# Patient Record
Sex: Male | Born: 1945 | Race: White | Hispanic: No
Health system: Southern US, Community
[De-identification: ages and names within clinical notes are randomized; demographics above are authoritative.]

## PROBLEM LIST (undated history)

## (undated) DIAGNOSIS — F419 Anxiety disorder, unspecified: Secondary | ICD-10-CM

## (undated) DIAGNOSIS — R42 Dizziness and giddiness: Secondary | ICD-10-CM

## (undated) DIAGNOSIS — K625 Hemorrhage of anus and rectum: Secondary | ICD-10-CM

## (undated) DIAGNOSIS — I10 Essential (primary) hypertension: Secondary | ICD-10-CM

## (undated) DIAGNOSIS — E785 Hyperlipidemia, unspecified: Secondary | ICD-10-CM

## (undated) DIAGNOSIS — C20 Malignant neoplasm of rectum: Secondary | ICD-10-CM

## (undated) DIAGNOSIS — I2510 Atherosclerotic heart disease of native coronary artery without angina pectoris: Secondary | ICD-10-CM

## (undated) DIAGNOSIS — C439 Malignant melanoma of skin, unspecified: Secondary | ICD-10-CM

## (undated) DIAGNOSIS — F32A Depression, unspecified: Secondary | ICD-10-CM

## (undated) DIAGNOSIS — D128 Benign neoplasm of rectum: Secondary | ICD-10-CM

## (undated) DIAGNOSIS — M1990 Unspecified osteoarthritis, unspecified site: Secondary | ICD-10-CM

## (undated) DIAGNOSIS — I208 Other forms of angina pectoris: Secondary | ICD-10-CM

## (undated) DIAGNOSIS — I2089 Other forms of angina pectoris: Secondary | ICD-10-CM

## (undated) DIAGNOSIS — I219 Acute myocardial infarction, unspecified: Secondary | ICD-10-CM

## (undated) DIAGNOSIS — F329 Major depressive disorder, single episode, unspecified: Secondary | ICD-10-CM

## (undated) HISTORY — DX: Benign neoplasm of rectum: D12.8

## (undated) HISTORY — DX: Hyperlipidemia, unspecified: E78.5

## (undated) HISTORY — DX: Acute myocardial infarction, unspecified: I21.9

## (undated) HISTORY — PX: CORONARY ANGIOPLASTY WITH STENT PLACEMENT: SHX49

## (undated) HISTORY — DX: Other forms of angina pectoris: I20.8

## (undated) HISTORY — PX: FINGER SURGERY: SHX640

## (undated) HISTORY — PX: TONSILLECTOMY: SUR1361

## (undated) HISTORY — DX: Depression, unspecified: F32.A

## (undated) HISTORY — DX: Dizziness and giddiness: R42

## (undated) HISTORY — DX: Major depressive disorder, single episode, unspecified: F32.9

## (undated) HISTORY — DX: Atherosclerotic heart disease of native coronary artery without angina pectoris: I25.10

## (undated) HISTORY — DX: Other forms of angina pectoris: I20.89

## (undated) HISTORY — DX: Hemorrhage of anus and rectum: K62.5

## (undated) HISTORY — DX: Essential (primary) hypertension: I10

## (undated) HISTORY — DX: Malignant melanoma of skin, unspecified: C43.9

---

## 2002-01-03 DIAGNOSIS — C439 Malignant melanoma of skin, unspecified: Secondary | ICD-10-CM

## 2002-01-03 HISTORY — DX: Malignant melanoma of skin, unspecified: C43.9

## 2005-10-03 ENCOUNTER — Inpatient Hospital Stay (HOSPITAL_COMMUNITY): Admission: AD | Admit: 2005-10-03 | Discharge: 2005-10-04 | Payer: Self-pay | Admitting: Internal Medicine

## 2005-10-03 ENCOUNTER — Ambulatory Visit: Payer: Self-pay | Admitting: Critical Care Medicine

## 2005-10-04 IMAGING — US US CHEST/MEDIASTINUM
1 series · 6 of 6 positions shown · non-contrast
Comparison: none

CLINICAL DATA: Effusion. 
 CHEST ULTRASOUND:

[Series 1: unknown · 0.33mm/px · 6 of 6 slices shown]
[im 1/6]
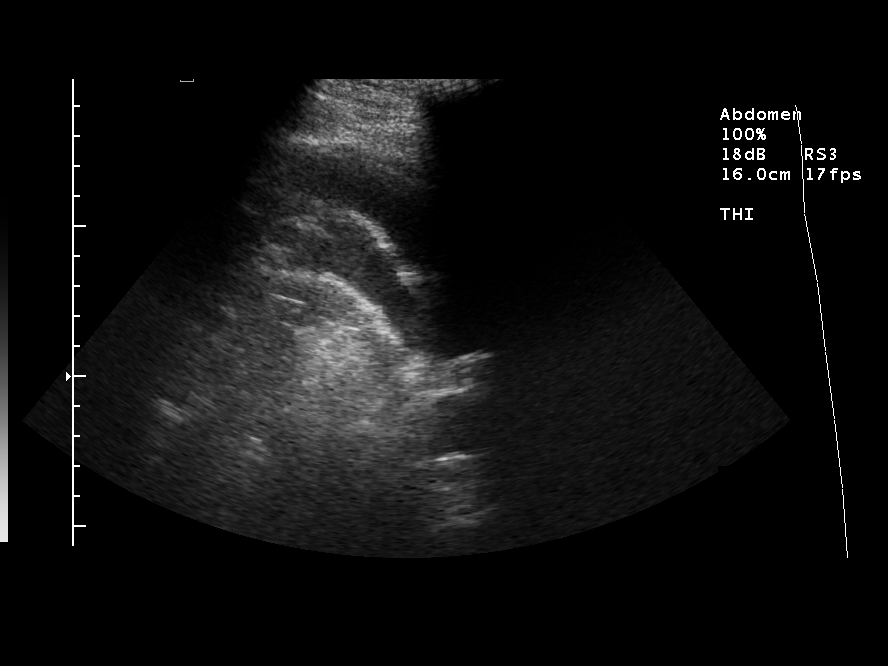
[im 2/6]
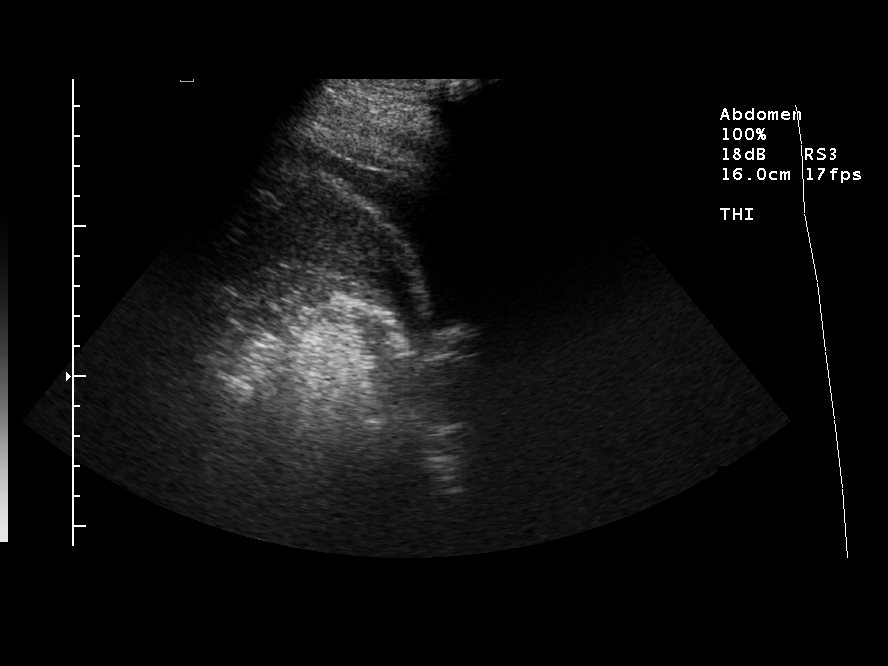
[im 3/6]
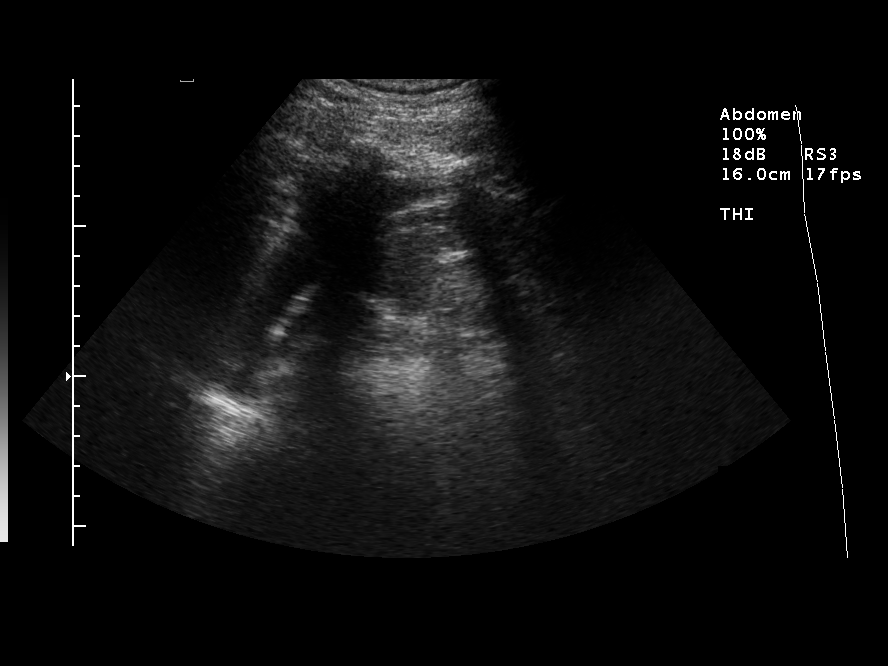
[im 4/6]
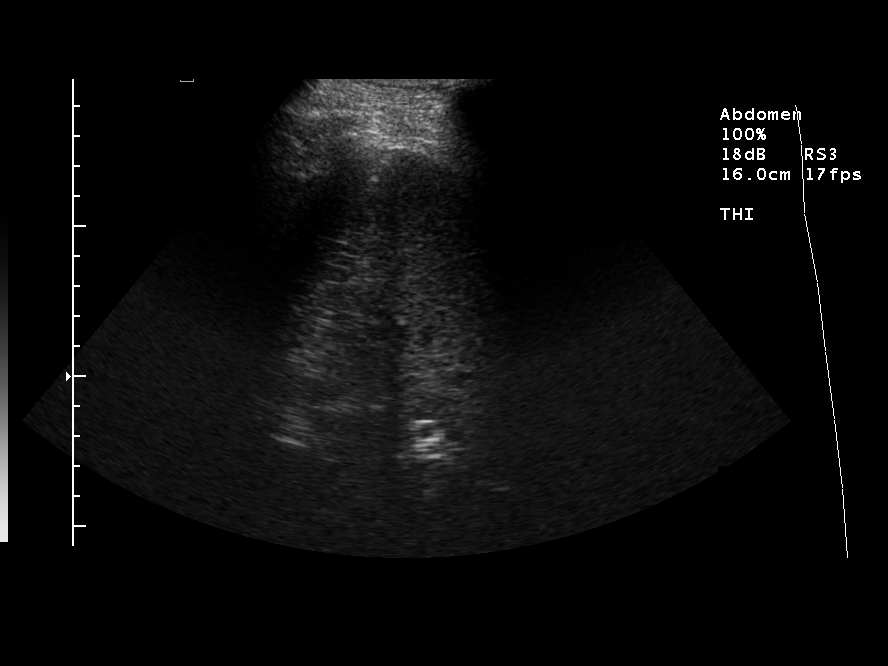
[im 5/6]
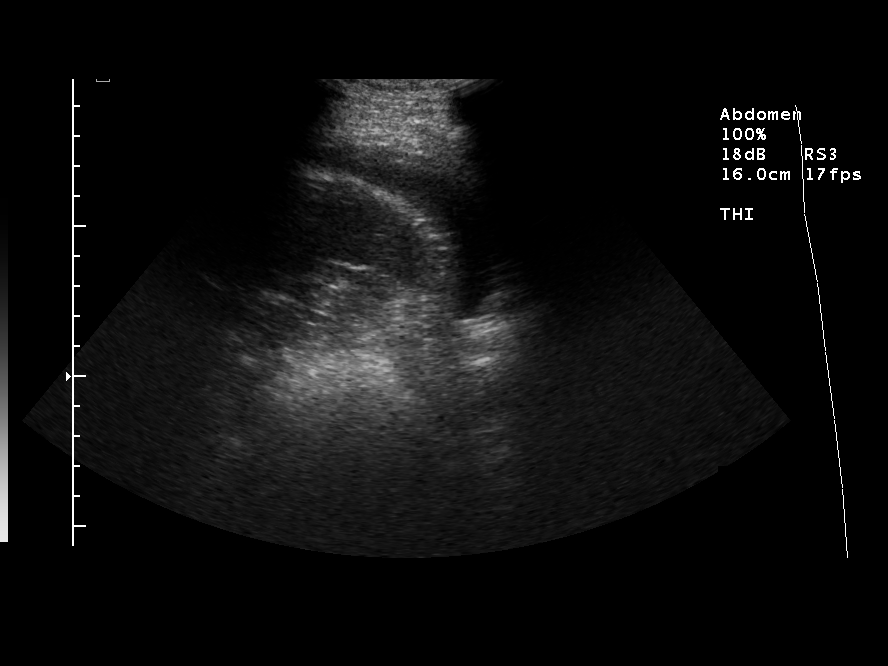
[im 6/6]
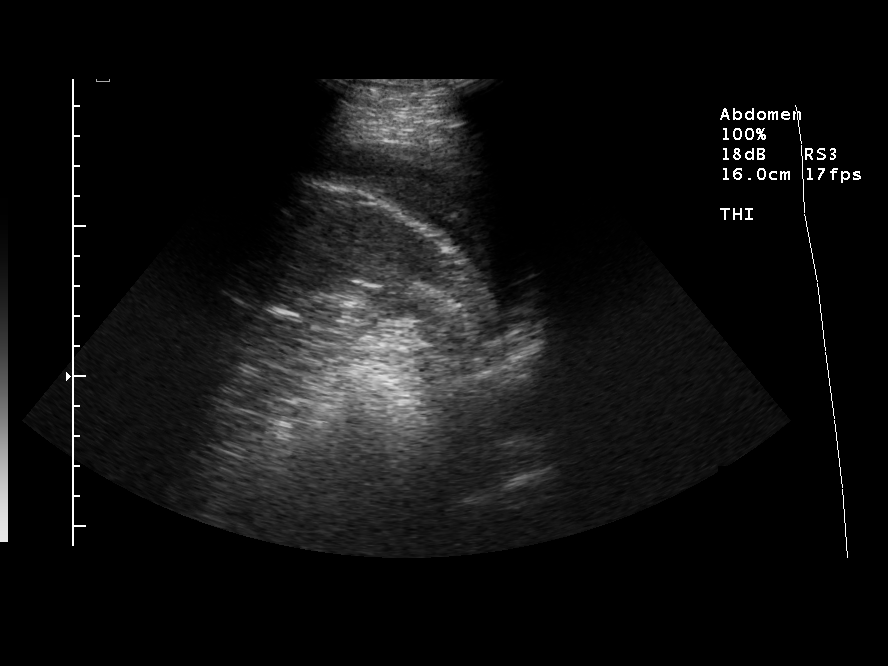

[6 of 6 positions shown; findings below may reference images not displayed]

FINDINGS: A tiny left pleural effusion is present.  Safe thoracentesis could not be performed.
IMPRESSION: Tiny left pleural effusion.

## 2005-10-12 ENCOUNTER — Ambulatory Visit: Payer: Self-pay | Admitting: Critical Care Medicine

## 2006-06-15 ENCOUNTER — Encounter: Admission: RE | Admit: 2006-06-15 | Discharge: 2006-06-15 | Payer: Self-pay | Admitting: Obstetrics and Gynecology

## 2010-01-03 HISTORY — PX: CARDIAC CATHETERIZATION: SHX172

## 2010-05-21 NOTE — H&P (Signed)
NAME:  Brandon Mora, Brandon Mora                 ACCOUNT NO.:  [id number]   MEDICAL RECORD NO.:  [id number]          PATIENT TYPE:  INP   LOCATION:  4738                         FACILITY:  [facility]   PHYSICIAN:  Corinna L. Lendell Caprice, MDDATE OF BIRTH:  [birth year]   DATE OF ADMISSION:  10/03/2005  DATE OF DISCHARGE:                                HISTORY & PHYSICAL   CHIEF COMPLAINT:  Chest pain.   HISTORY OF PRESENT ILLNESS:  Brandon Mora is a pleasant 65 year old white male  patient of Dr. Foy Guadalajara who was directly admitted with chest pain and pleural  effusion.  He began having sharp knife-like pleuritic chest pain 3 days ago.  He also has been very fatigued.  He has had muscle aches.  He went to the  [facility], a chest x-ray was ordered and an EKG.  The EKG showed  normal sinus rhythm.  The chest x-ray showed a stranding opacity at both  lung bases with probable small pleural effusion, suggestive of atelectasis  and/or subsegmental collapse.  A repeat chest x-ray done today, the films of  which are brought with the patient, shows a moderate left pleural effusion.  I do not have the film from Saturday.  The patient's chest pain is currently  gone.  He has been taking ibuprofen for at toothache.   PAST MEDICAL HISTORY:  1. Hyperlipidemia.  2. Malignant melanoma of the left face which was resected.  3. Angioplasty in the 1980s.   MEDICATIONS:  1. Lipitor 20 mg a day.  2. Aspirin 325 mg a day.  3. Saw palmetto.  4. Glucosamine.  5. Multivitamin.  6. Echinacea.  7. Ibuprofen.   SOCIAL HISTORY:  The patient does not smoke or drink.  He is a Social worker.  He is married.   FAMILY HISTORY:  His mother had hypertension and died of old age.  His  father died in his 88s of an MI.   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   REVIEW OF SYSTEMS:  As above otherwise negative.   PHYSICAL EXAMINATION:  VITAL  SIGNS:  His temperature is 97.7, heart rate 57,  respiratory rate 28, blood pressure 166/81.  GENERAL:  The patient is well-nourished and well-developed in no acute  distress.  HEENT: Normocephalic, atraumatic.  Pupils equal, round, reactive to light.  Sclerae nonicteric.  Moist mucous membranes.  NECK:  Supple.  No lymphadenopathy.  LUNGS:  Clear to auscultation bilaterally without wheezes, rhonchi or rales.  CARDIOVASCULAR:  Regular rate and rhythm without murmurs, gallops or rubs.  No chest wall tenderness.  ABDOMEN:  Soft, nontender, nondistended.  GU:  Deferred.  RECTAL:  Deferred.  EXTREMITIES:  No clubbing, cyanosis or edema.  SKIN:  No rash or suspicious looking moles.  NEUROLOGIC:  Alert and oriented.  Cranial nerves and sensory motor exam are  intact.  LYMPHATICS:  No lymphadenopathy.   ASSESSMENT/PLAN:  1. Pleuritic left chest pain with enlarging pleural effusion.  I will get  a CT angiogram rule out pulmonary embolus.  He may require a      thoracentesis.  Also given his muscle aches, I will check a CPK, ANA,      and erythrocyte sedimentation rate as this could potentially be a      serositis.  I will also check TSH, C-MET, CBC, D-dimer.  2. Hyperlipidemia.  3. Coronary artery disease with history of balloon angioplasty.      Corinna L. Lendell Caprice, MD  Electronically Signed     CLS/MEDQ  D:  10/03/2005  T:  10/04/2005  Job:  [id number]   cc:   Molly Maduro L. Foy Guadalajara, M.D.

## 2010-05-21 NOTE — Consult Note (Signed)
NAME:  Brandon Mora, Brandon Mora                 ACCOUNT NO.:  [id number]   MEDICAL RECORD NO.:  [id number]          PATIENT TYPE:  INP   LOCATION:  4738                         FACILITY:  [facility]   PHYSICIAN:  Corky Crafts, MDDATE OF BIRTH:  Aug 07, 1945   DATE OF CONSULTATION:  10/04/2005  DATE OF DISCHARGE:                                   CONSULTATION   REASON FOR CONSULTATION:  Chest pain.   Referring MD:  Marinda Elk, MD   HISTORY OF PRESENT ILLNESS:  The patient is a 65 year old male with a  history of coronary artery disease who had a PTCA in 1987.  He has been  having an accumulating pleural effusion over the past week.  There  apparently was associated atelectasis.  He was seen by Dr. Annitta Needs and sent to  the hospital.  He apparently has had some low grade fevers.  He also had  pain which initially was in his chest.  When he would lie down it would  radiate up into his neck.  He also reports some weakness over the past few  days.  The pain that he is having now is not similar to his prior angina.  He has not had any syncope.  Currently he is pain free.   PAST MEDICAL HISTORY:  1. Coronary artery status post PTCA.  2. Borderline hypertension.  3. High cholesterol.   PAST SURGICAL HISTORY:  Finger amputation.   ALLERGIES:  NO KNOWN DRUG ALLERGIES.   MEDICATIONS:  1. Lipitor 20 mg daily.  2. Aspirin 325 mg daily.  3. Multiple herbs.   FAMILY HISTORY:  Paternal grandfather died of an MI at 53.  Uncle died of an  MI  at 31.   SOCIAL HISTORY:  No tobacco, no alcohol.  Works as a Surveyor, minerals.   REVIEW OF SYSTEMS:  Significant for chest pain and low grade fevers.  Also  decreased appetite.  At times he will become weak with significant exertion.  All other systems negative.   PHYSICAL EXAM:  Blood pressures ranging from 150-173/90, heart rate from 50-  60.  GENERAL:  The patient is awake, alert, in no apparent distress.  NECK:  Shows no JVD.  No carotid bruits.  HEAD:   Normocephalic, atraumatic.  CARDIOVASCULAR:  Bradycardic.  S1-S2.  No murmurs.  LUNGS:  Decreased breath sounds at the left base.  ABDOMEN:  Soft, nontender, nondistended.  EXTREMITIES:  Showed no edema.  2+ dorsalis pedis pulses bilaterally.  NEURO:  No focal deficits.  BACK:  No kyphoscoliosis or scoliosis.  SKIN:  No rash.  PSYCH:  Normal mood and affect.   LABORATORY DATA:  Shows creatinine 1.2, hematocrit of 38.9, total CK of 72.   ASSESSMENT/PLAN:  A 65 year old with prior coronary artery disease and a new  pleural effusion accumulating over several days.   PLAN:  1. The patient will be receiving an ultrasound-guided thoracentesis today      to hopefully shed light on his etiology of the pleural fluid.  2. I doubt this is cardiac chest pain.  Would check an echocardiogram to  evaluate for left ventricular function.  We will plan for an outpatient      stress test when he has recovered from this episode of pleurisy to      evaluate for ischemia.  3. Continue Lipitor for increasing cholesterol.  His last LDL was 110 and      goal LDL is less than 100 because of his prior PTCA.  4. The patient has not been started on blood pressure medicines.  He has      been somewhat hypertensive in the hospital.  We will follow his blood      pressures. I think it is likely that he will eventually need blood      pressure medication.      Corky Crafts, MD  Electronically Signed     JSV/MEDQ  D:  10/04/2005  T:  10/05/2005  Job:  [id number]

## 2010-05-21 NOTE — Discharge Summary (Signed)
NAME:  Brandon Mora, Brandon Mora                 ACCOUNT NO.:  [id number]   MEDICAL RECORD NO.:  [id number]          PATIENT TYPE:  INP   LOCATION:  4738                         FACILITY:  [facility]   PHYSICIAN:  Jackie Plum, M.D.DATE OF BIRTH:  1945-09-08   DATE OF ADMISSION:  10/03/2005  DATE OF DISCHARGE:  10/04/2005                                 DISCHARGE SUMMARY   DISCHARGE DIAGNOSES:  1. Pleuritic with chest pain, chest pain is resolved.  2. Left, small pleural effusion.  3. History of coronary artery disease.  4. Atypical pneumonia.   DISCHARGE MEDICATIONS:  Patient is to resume all his preadmission  medications.  Please see H and P.  New medicines are Doxycycline 100 mg p.o.  b.i.d.   CONSULTATIONS:  Shan Levans of pulmonary.   PROCEDURES:  Not applicable.   CONDITION ON DISCHARGE:  Improved, satisfactory.   Patient presented with a 3-day history of pleuritic, left-sided chest pain.  X-ray of the chest had been done, which showed a pleural effusion.  However,  followup x-rays have indicated worsening of the perfusion and patient was  admitted directly by Dr. Lendell Caprice for further evaluation and management.  Apparently, patient has had some insect bite involving his left lower  extremity earlier.  On account of this, autoimmune workup was initiated and  the results of which are pending at the time of this dictation, and this  needs to be followed up as an outpatient.  CT scan was done, which showed no  evidence of PE.  There was overlying left effusion with atelectasis.  Patient was scheduled for thoracentesis, but this could not be done because  the effusion was so small.  Patient otherwise was feeling fine.  He is not  having any fever or chills.  He does not have any significant chest pain.  He was seen by cardiology, who indicated that his chest pain is actually  atypical, which is agreeable, and recommended 2-D echocardiogram for  evaluation of his left ventricular  function in view of his cardiac history.  The results of the ECG to be followed up as an outpatient as well.  His  vital signs indicated a BP of 140/80, pulse 54, respirations 18, temperature  97.4, and 95% on room air pulse oximetry.  Vital signs are intact.  Finding  of 9.9 mm, hepatic, low density lesion, which is thought to be the presence  of a simple cyst.  This will need to be followed with repeat scan at a later  time.  His cardiopulmonary exam was notable for dyspnea with rhonchi at the  left base.  Abdomen is soft, nontender.  Bowel sounds a present.  Extremities show no cyanosis.   Patient's lab work indicated a WBC of 7.2, hemoglobin 12.9, hematocrit 38.9,  MCV 97.5, platelet count 229,000.  A D-Dimer was 0.26, which is within  normal limits.  ESR was slightly elevated at 21.  His chemistries were  notable for a glucose of 117, total protein of 5.6, albumin of 3.1.  Otherwise, his complete metabolic panel was within normal limits.  TSH was  0.684.   Dr. Lendell Caprice saw the patient yesterday and ordered autoimmune lab work  as  an outpatient.  I have requested that the patient's autoimmune level be sent  to his PCP.  Patient had been seen by Dr. Delford Field, who felt that patient was  appropriate and well enough to be discharged home with outpatient followup.  In view of his history of insect bite, he felt that covering patient with  doxycycline  is appropriate and workup has been obtained already and this  will need follow up as well.  Patient discharged in stable and better  condition.      Jackie Plum, M.D.  Electronically Signed     GO/MEDQ  D:  10/04/2005  T:  10/05/2005  Job:  [id number]   cc:   Francisca December, M.D.  Charlcie Cradle Delford Field, MD, FCCP

## 2010-05-21 NOTE — Assessment & Plan Note (Signed)
[facility]                               PULMONARY OFFICE NOTE   [name], [name]                        MRN:          [id number]  DATE:10/12/2005                            DOB:          Mar 10, 1945    Mr. Brandon Mora returns today in post hospital followup.  A 65 year old white male  who was hospitalized for left pleural effusion and pneumonia with atypical  presentation.  His rickettsia and Lyme's disease titers were unremarkable.  He is currently asymptomatic from a pulmonary standpoint.  There is no  fever, chills or sweats.   EXAM:  Temperature 98, blood pressure 130/82, pulse 55.  Saturation 95% room  air.  CHEST:  Showed to be clear without evidence of wheeze or rhonchi.  CARDIAC:  Showed a regular rate and rhythm without S3.  Normal S1, S2.  ABDOMEN:  Was soft, nontender.  EXTREMITIES:  Showed no edema or clubbing or clubbing or venous disease.  SKIN:  Was clear.  NEUROLOGIC:  Was intact.  HEENT:  Showed no jugular venous distention, no lymphadenopathy.  Oropharynx  clear. Neck supple.   Chest x-ray was obtained today showed total clearance of left pleural  effusion, left infiltrate.   IMPRESSION:  Is that of resolved atypical pneumonia with parapneumonic  effusion.   RECOMMENDATIONS:  1. For the patient to finish course of doxycycline for 3 additional days.  2. No pulmonary followup is necessary.  We will see the patient back as      needed.       Charlcie Cradle Delford Field, MD, [facility]      PEW/MedQ  DD:  10/12/2005  DT:  10/14/2005  Job #:  [id number]   cc:   Molly Maduro L. Foy Guadalajara, M.D.

## 2010-10-01 ENCOUNTER — Inpatient Hospital Stay (HOSPITAL_BASED_OUTPATIENT_CLINIC_OR_DEPARTMENT_OTHER)
Admission: RE | Admit: 2010-10-01 | Discharge: 2010-10-01 | Disposition: A | Discharge status: Home / Self Care | Payer: Medicare Other | Source: Ambulatory Visit | Attending: Interventional Cardiology | Admitting: Interventional Cardiology

## 2010-10-01 ENCOUNTER — Observation Stay: Admission: AD | Admit: 2010-10-01 | Payer: Self-pay | Source: Ambulatory Visit | Admitting: Interventional Cardiology

## 2010-10-01 DIAGNOSIS — I209 Angina pectoris, unspecified: Secondary | ICD-10-CM | POA: Insufficient documentation

## 2010-10-01 DIAGNOSIS — I2510 Atherosclerotic heart disease of native coronary artery without angina pectoris: Secondary | ICD-10-CM | POA: Insufficient documentation

## 2010-10-12 ENCOUNTER — Ambulatory Visit (HOSPITAL_COMMUNITY)
Admission: RE | Admit: 2010-10-12 | Discharge: 2010-10-13 | Disposition: A | Discharge status: Home / Self Care | Payer: Medicare Other | Source: Ambulatory Visit | Attending: Interventional Cardiology | Admitting: Interventional Cardiology

## 2010-10-12 DIAGNOSIS — I2510 Atherosclerotic heart disease of native coronary artery without angina pectoris: Secondary | ICD-10-CM | POA: Insufficient documentation

## 2010-10-12 DIAGNOSIS — I10 Essential (primary) hypertension: Secondary | ICD-10-CM | POA: Insufficient documentation

## 2010-10-12 DIAGNOSIS — E785 Hyperlipidemia, unspecified: Secondary | ICD-10-CM | POA: Insufficient documentation

## 2010-10-12 DIAGNOSIS — I209 Angina pectoris, unspecified: Secondary | ICD-10-CM | POA: Insufficient documentation

## 2010-10-13 LAB — CBC
HCT: 40.2 % (ref 39.0–52.0)
MCV: 90.3 fL (ref 78.0–100.0)
RBC: 4.45 MIL/uL (ref 4.22–5.81)
WBC: 7.3 10*3/uL (ref 4.0–10.5)

## 2010-10-13 LAB — BASIC METABOLIC PANEL
BUN: 22 mg/dL (ref 6–23)
CO2: 24 mEq/L (ref 19–32)
Chloride: 106 mEq/L (ref 96–112)
Creatinine, Ser: 0.8 mg/dL (ref 0.50–1.35)

## 2010-11-01 NOTE — Cardiovascular Report (Signed)
NAME:  Brandon Mora, Brandon Mora NO.:  [id number]  MEDICAL RECORD NO.:  [id number]  LOCATION:                                 FACILITY:  PHYSICIAN:  Corky Crafts, MDDATE OF BIRTH:  1945-12-12  DATE OF PROCEDURE:  10/01/2010 DATE OF DISCHARGE:                           CARDIAC CATHETERIZATION   REFERRING PHYSICIAN:  Robert L. Foy Guadalajara, MD.  PROCEDURES PERFORMED:  Left heart catheterization, left ventriculogram, coronary angiogram, abdominal aortogram.  OPERATOR:  Corky Crafts, MD.  INDICATIONS:  Stable angina.  PROCEDURE NARRATIVE:  The risks and benefits of cardiac catheterization were explained to the patient.  Informed consent was obtained.  He was brought to the cath lab.  He was prepped and draped in usual sterile fashion.  His right groin was infiltrated with 1% lidocaine.  A 4-French sheath was placed into the right common femoral artery using modified Seldinger technique.  Left coronary artery angiography was performed using a JL-4 pigtail catheter.  Catheter was advanced to the vessel ostium under fluoroscopic guidance.  Digital angiography was performed in multiple projections using hand injection of contrast.  Right coronary artery angiography was performed using JR-4 pigtail catheter in a similar fashion.  Pigtail catheter was advanced to the descending aorta and across the aortic valve under fluoroscopic guidance.  Power injection contrast was performed in the RAO projection to image the left ventricle.  The catheter was pulled back under continuous hemodynamic pressure monitoring.  It was withdrawn to the abdominal aorta.  A power injection contrast was performed in the AP projection to image the infrarenal aorta.  The sheath was pulled using manual compression.  FINDINGS:  The left main was widely patent.  The left circumflex is a small vessel.  There is moderate proximal disease.  There is a small OM- 1 which was widely patent.  The  ramus vessel was a medium-sized branching vessel.  The lower pole had an 90% proximal stenosis.  There is 30%-40% stenosis in the parent vessel just before the bifurcation and extending into the upper pole.  The LAD is a large vessel which wraps around the apex.  There is a 40%- 50% mid lesion.  The first diagonal is occluded.  There are left-to-left collaterals.  The right coronary artery is a large dominant vessel proximally.  There is some moderate disease in the mid vessel.  There is 60%-70% stenosis in the distal vessel.  There is 60%-70% stenosis before the bifurcation, posterolateral PDA vessels appear patent.  HEMODYNAMICS:  Left ventricular pressure 126/60 with an LVEDP of 17 mmHg.  Aortic pressure 126/63 with a mean aortic pressure of 88 mmHg.  Left ventriculogram shows a normal ventricular function with ejection fraction of 55%-60%  IMPRESSION: 1. Significant disease in a small branch off of the ramus. 2. Moderate disease in the mid LAD, the mid-right coronary artery, and     the distal right coronary artery. 3. Normal left ventricular function.  RECOMMENDATIONS:  We will plan to treat the patient medically, explained to them regarding the technical difficulties of the bifurcation in the ramus the fact that this is a small vessel, also I think medical therapy more reasonable.  He has been feeling well of late, he has not used any nitroglycerin under his tongue at home.  We will add Plavix today.  I will see him back in a week.  If he does have further symptoms, I would consider further evaluation of the right coronary artery.     Corky Crafts, MD     JSV/MEDQ  D:  10/01/2010  T:  10/01/2010  Job:  [id number]  Electronically Signed by Lance Muss MD on 11/01/2010 01:17:35 PM

## 2010-11-01 NOTE — Discharge Summary (Signed)
  NAME:  Brandon Mora, Brandon Mora NO.:  [id number]  MEDICAL RECORD NO.:  [id number]  LOCATION:  2507                         FACILITY:  [facility]  PHYSICIAN:  Corky Crafts, MDDATE OF BIRTH:  1945-12-16  DATE OF ADMISSION:  10/12/2010 DATE OF DISCHARGE:  10/13/2010                              DISCHARGE SUMMARY   FINAL DIAGNOSES: 1. Coronary artery disease. 2. Hyperlipidemia. 3. Hypertension.  PROCEDURES PERFORMED:  Cardiac catheterization with PCI of the distal RCA, mid RCA and mid LAD performed on October 12, 2010.  HOSPITAL COURSE:  The patient was admitted after having his cardiac cath done.  He tolerated the procedure well.  He had a good radial pulse, the morning after the procedure, he had some mild right arm soreness but it had improved.  He walked with Cardiac rehab and had no chest discomfort. Overall, he felt well.  We did discuss that I would like him to avoid lifting more than 10 pounds certainly for the next week, but in general I would like to have him avoid heavy straining, he is in agreement.  We also checked a platelet function test given his multivessel stenting, he had adequate platelet inhibition from Plavix.  DISCHARGE MEDICATIONS: 1. Aspirin 325 mg daily. 2. co-enzyme q10. 3. Effexor 75 mg p.o. daily. 4. Lisinopril 40 mg daily. 5. Livalo 2 mg daily. 6. Multivitamins. 7. Daily sublingual nitroglycerin p.r.n. 8. Plavix 75 mg daily. 9. Saw palmetto 3 capsules daily. 10.Vitamin B12 one tablet daily. 11.Zyrtec 10 mg daily.  ACTIVITY:  Increase activity slowly.  DIET:  Low-sodium heart healthy diet.  FOLLOWUP APPOINTMENTS:  Dr. Eldridge Dace on November 2nd at 9:15 a.m.  Further activity is outlined above.     Corky Crafts, MD     JSV/MEDQ  D:  10/13/2010  T:  10/13/2010  Job:  [id number]  Electronically Signed by Lance Muss MD on 11/01/2010 01:17:39 PM

## 2010-11-01 NOTE — Cardiovascular Report (Signed)
NAME:  Brandon Mora, Brandon Mora NO.:  [id number]  MEDICAL RECORD NO.:  [id number]  LOCATION:  [facility]                         FACILITY:  [facility]  PHYSICIAN:  Corky Crafts, MDDATE OF BIRTH:  06/07/1945  DATE OF PROCEDURE:  10/12/2010 DATE OF DISCHARGE:                           CARDIAC CATHETERIZATION   PROCEDURE PERFORMED:  PCI of the right coronary artery, FFR of the LAD and PCI of the LAD.  OPERATORS:  Corky Crafts, MD  INDICATIONS:  Increasing angina.  PROCEDURE NARRATIVE:  The patient was brought to the cath lab.  He was prepped and draped in the usual sterile fashion.  Diagnostic catheterization had been performed few days earlier revealing a moderate stenosis in the mid LAD and significant stenosis in the mid RCA and distal RCA.  The right wrist was infiltrated with 1% lidocaine.  A 6- [place] was placed into the right radial artery using modified Seldinger technique.  Right coronary artery angiography was performed using a JR-4 catheter, initially there is difficulty in torquing this catheter and keeping it in the ostium, therefore, the catheter was exchanged for a Williams guide catheter.  There was significant tortuosity in the right subclavian and therefore keeping the wire in for engaging the vessel was very helpful.  Angiomax was used for anticoagulation.  Prowater wire was placed into the right coronary artery.  Subsequently, a BMW wire was placed across the lesions in the mid RCA and the distal right.  The Prowater wire was left in a marginal for additional support.  A 2.0 x 12 Emerge balloon was used to predilate the distal lesion inflated to 10 atmospheres.  The distal RCA was stented with a 2.5 x 16 PROMUS stent deployed at 14 atmospheres for 23 seconds.  The stent was post dilated with a 2.75 x 12 [place] Quantum apex balloon inflated to 12 atmospheres twice.  Attention was then turned to the mid RCA.  A 3.0 x 20 stent was used to  direct the stent to the mid RCA and deployed at 16 atmospheres.  The stent was post dilated with 3.25 x 15 [place] Quantum apex balloon inflated to 18 atmospheres.  There was no residual stenosis.  TIMI-3 flow was maintained throughout in the right coronary artery.  We then placed a CLS 3.0 guiding catheter into the left main. Nitroglycerin was given into the LAD to help relieve vasospasm and try to maximize the diameter of the vessel.  A FFR wire was placed down the LAD, resting FFR across the lesion was 0.95, after intravenous adenosine was given the FFR dropped to 0.81.  Given the patient's symptoms and his own request to have as much done as possible we elected to go ahead and revascularize this area.  A 2.75 x 16 PROMUS element stent was placed across the lesion and deployed to 14 atmospheres for 21 seconds.  There was some plaque shift that appeared proximal perhaps this was as dissection.  We took a 2.5 x 12 PROMUS stent overlapping the proximal edge of the previously placed stent deployed at 12 atmospheres.  The stent balloon was advanced to the overlap and inflated to 18 atmospheres and then again  at 12 atmospheres.  The entire stented area was post dilated with a [place] Trek 3.0 x 15 balloon inflated to 12 atmospheres twice. Intracoronary NTG was given to treat wire spasm successfully. There was no residual stenosis in the LAD.  TIMI-3 flow was maintained.  The patient tolerated the procedure well.  IMPRESSION: 1. Two-vessel coronary artery disease involving the right coronary     artery distal/mid. 2. FFR 0.81 in the mid left anterior descending, 2 stents placed in     the mid left anterior descending overlapping both her drug-eluting     stent.  RECOMMENDATIONS:  Continue aspirin and Plavix indefinitely and will watch the patient overnight.  Continue aggressive secondary prevention.     Corky Crafts, MD     JSV/MEDQ  D:  10/12/2010  T:  10/12/2010  Job:   [id number]  Electronically Signed by Lance Muss MD on 11/01/2010 01:18:53 PM

## 2011-01-04 DIAGNOSIS — C20 Malignant neoplasm of rectum: Secondary | ICD-10-CM

## 2011-01-04 HISTORY — DX: Malignant neoplasm of rectum: C20

## 2011-07-05 DIAGNOSIS — I219 Acute myocardial infarction, unspecified: Secondary | ICD-10-CM | POA: Insufficient documentation

## 2011-07-05 HISTORY — DX: Acute myocardial infarction, unspecified: I21.9

## 2011-10-04 DIAGNOSIS — K625 Hemorrhage of anus and rectum: Secondary | ICD-10-CM

## 2011-10-04 HISTORY — DX: Hemorrhage of anus and rectum: K62.5

## 2011-10-11 DIAGNOSIS — D128 Benign neoplasm of rectum: Secondary | ICD-10-CM

## 2011-10-11 HISTORY — DX: Benign neoplasm of rectum: D12.8

## 2011-10-19 ENCOUNTER — Ambulatory Visit (INDEPENDENT_AMBULATORY_CARE_PROVIDER_SITE_OTHER): Payer: Medicare Other | Admitting: General Surgery

## 2011-10-19 ENCOUNTER — Encounter (INDEPENDENT_AMBULATORY_CARE_PROVIDER_SITE_OTHER): Payer: Self-pay | Admitting: General Surgery

## 2011-10-19 VITALS — BP 148/88 | HR 56 | Temp 97.4°F | Resp 18 | Ht 67.0 in | Wt 174.6 lb

## 2011-10-19 DIAGNOSIS — I2510 Atherosclerotic heart disease of native coronary artery without angina pectoris: Secondary | ICD-10-CM

## 2011-10-19 DIAGNOSIS — Z9861 Coronary angioplasty status: Secondary | ICD-10-CM

## 2011-10-19 DIAGNOSIS — D490 Neoplasm of unspecified behavior of digestive system: Secondary | ICD-10-CM

## 2011-10-19 NOTE — Patient Instructions (Signed)
We will call you after we speak with your cardiologist.  CENTRAL  SURGERY  ONE-DAY (1) PRE-OP HOME COLON PREP INSTRUCTIONS: ** MIRALAX / GATORADE PREP **  Fill the two prescriptions at a pharmacy of your choice.  You must follow the instructions below carefully.  If you have questions or problems, please call and speak to someone in the clinic department at our office:   [phone].  MIRALAX - GATORADE -- DULCOLAX TABS:   Fill the prescriptions for MIRALAX  (255 gm bottle)    In addition, purchase four (4) DULCOLAX TABLETS (no prescription required), and one 64 oz GATORADE.  (Do NOT purchase red Gatorade; any other flavor is acceptable).  INSTRUCTIONS: 1. Five days prior to your procedure do not eat nuts, popcorn, or fruit with seeds.  Stop all fiber supplements such as Metamucil, Citrucel, etc.  2. The day before your procedure: o 6:00am:  take (4) Dulcolax tablets.  You should remain on clear liquids for the entire day.   CLEAR LIQUIDS: clear bouillon, broth, jello (NOT RED), black coffee, tea, soda, etc o 10:00am:  add the bottle of MiraLax to the 64-oz bottle of Gatorade, and dissolve.  Begin drinking the Gatorade mixture until gone (8 oz every 15-30 minutes).  Continue clear liquids until midnight.  3. The day of your procedure:   Do not eat or drink ANYTHING after midnight before your surgery.     If you take Heart or Blood Pressure medicine, ask the pre-op nurses about these during your preop appointment.   Further pre-operative instructions will be given to you from the hospital.   Expect to be contacted 5-7 days before your surgery.

## 2011-10-19 NOTE — Progress Notes (Signed)
Patient ID: Brandon Mora, male   DOB: July 27, 1945, 66 y.o.   MRN: [id number]  Chief Complaint  Patient presents with  . Mass    new pt- eval rectal mass    HPI Brandon Mora is a 66 y.o. male.   HPI  He is referred by Dr. Evette Cristal for further evaluation and treatment of a rectal tumor. He had been having some intermittent rectal bleeding for 6 months. He had never had a colonoscopy. Dr. Evette Cristal performed a colonoscopy which demonstrated a 4 cm rectal tumor. 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 He is here to discuss complete removal of the rectal tumor.  One complicating factor is that he had coronary angioplasty with placement of drug-eluting stents in 3 months ago. He is on Plavix and aspirin. He is asymptomatic from a cardiac standpoint.  Past Medical History  Diagnosis Date  . Hyperlipidemia   . Hypertension   . Heart attack   . CAD (coronary artery disease)     Past Surgical History  Procedure Date  . Finger surgery   . Cardiac catheterization 2012    x2  . Coronary angioplasty with stent placement     Family History  Problem Relation Age of Onset  . Heart disease Father     Social History History  Substance Use Topics  . Smoking status: Never Smoker   . Smokeless tobacco: Not on file  . Alcohol Use: No    Allergies  Allergen Reactions  . Lipitor (Atorvastatin)   . Statins     Muscle weakness    Current Outpatient Prescriptions  Medication Sig Dispense Refill  . buPROPion (WELLBUTRIN XL) 150 MG 24 hr tablet daily.      . clopidogrel (PLAVIX) 75 MG tablet daily.      . isosorbide mononitrate (IMDUR) 30 MG 24 hr tablet daily.      Marland Kitchen lisinopril (PRINIVIL,ZESTRIL) 30 MG tablet daily.      . metoprolol succinate (TOPROL-XL) 25 MG 24 hr tablet daily.      Gilman Schmidt TEST test strip       . venlafaxine XR (EFFEXOR-XR) 75 MG 24 hr capsule daily.      Marland Kitchen ZETIA 10 MG tablet daily.         Review of Systems Review of Systems  Constitutional: Negative.   HENT: Positive for hearing loss.   Respiratory: Negative for shortness of breath.   Cardiovascular: Negative for chest pain.  Gastrointestinal: Positive for blood in stool and anal bleeding. Negative for abdominal pain.  Genitourinary: Negative.   Hematological: Bruises/bleeds easily.    Blood pressure 148/88, pulse 56, temperature 97.4 F (36.3 C), temperature source Temporal, resp. rate 18, height 5\' 7"  (1.702 m), weight 174 lb 9.6 oz (79.198 kg).  Physical Exam Physical Exam  Constitutional: He appears well-developed and well-nourished. No distress.  HENT:  Head: Normocephalic and atraumatic.  Eyes: EOM are normal. No scleral icterus.  Neck: Neck supple.  Cardiovascular: Normal rate and regular rhythm.   Pulmonary/Chest: Effort normal and breath sounds normal.  Abdominal: Soft. He exhibits no distension and no mass. There is no tenderness.  Genitourinary:       No external perianal lesions. On digital rectal exam in the left position the tumor is palpable about 6 cm from the anal verge.  Anoscopy demonstrates the tumor about 6 cm from the anal verge.  Lymphadenopathy:    He has no cervical adenopathy.  Data Reviewed Colonoscopy, pathology, Cardiac catherization reports, Dr. Luan Moore notes.  Assessment    Rectal tumor which is a polyp with high grade glandular dysplasia but no evidence of malignancy on biopsy. This intermittently bleeds. This is complicated by the fact that he has recent placement of drug-eluting coronary stents and is on Plavix and aspirin. I spoke with his cardiologist, Dr. Vilinda Boehringer advised that he needs to be on his Plavix uninterrupted for 6 months. At that point the Plavix could be stopped for 4-5 days but he would need to stay on his aspirin around the time of the procedure.  Thus, we would need to wait until January before performing the procedure as it is too risky for stent  occlusion to be done before then. The patient and his wife would like to have it done sooner than that but they were informed that it would likely not be safe from a cardiac standpoint. We will have him come back in mid December and schedule surgery.    Plan    As above.       Saquoia Sianez J 10/19/2011, 12:26 PM

## 2011-10-25 ENCOUNTER — Ambulatory Visit (INDEPENDENT_AMBULATORY_CARE_PROVIDER_SITE_OTHER): Payer: Medicare Other | Admitting: General Surgery

## 2011-10-26 ENCOUNTER — Encounter: Payer: Self-pay | Admitting: Radiation Oncology

## 2011-10-26 DIAGNOSIS — D128 Benign neoplasm of rectum: Secondary | ICD-10-CM | POA: Insufficient documentation

## 2011-10-27 ENCOUNTER — Ambulatory Visit
Admission: RE | Admit: 2011-10-27 | Discharge: 2011-10-27 | Disposition: A | Discharge status: Home / Self Care | Payer: Medicare Other | Source: Ambulatory Visit | Attending: Radiation Oncology | Admitting: Radiation Oncology

## 2011-10-27 ENCOUNTER — Encounter: Payer: Self-pay | Admitting: Radiation Oncology

## 2011-10-27 VITALS — BP 130/69 | HR 56 | Temp 98.0°F | Resp 20 | Ht 67.0 in | Wt 178.0 lb

## 2011-10-27 DIAGNOSIS — D129 Benign neoplasm of anus and anal canal: Secondary | ICD-10-CM | POA: Insufficient documentation

## 2011-10-27 DIAGNOSIS — D128 Benign neoplasm of rectum: Secondary | ICD-10-CM | POA: Insufficient documentation

## 2011-10-27 DIAGNOSIS — I252 Old myocardial infarction: Secondary | ICD-10-CM | POA: Insufficient documentation

## 2011-10-27 DIAGNOSIS — I2510 Atherosclerotic heart disease of native coronary artery without angina pectoris: Secondary | ICD-10-CM | POA: Insufficient documentation

## 2011-10-27 DIAGNOSIS — Z7982 Long term (current) use of aspirin: Secondary | ICD-10-CM | POA: Insufficient documentation

## 2011-10-27 DIAGNOSIS — E785 Hyperlipidemia, unspecified: Secondary | ICD-10-CM | POA: Insufficient documentation

## 2011-10-27 DIAGNOSIS — Z9861 Coronary angioplasty status: Secondary | ICD-10-CM | POA: Insufficient documentation

## 2011-10-27 DIAGNOSIS — I10 Essential (primary) hypertension: Secondary | ICD-10-CM | POA: Insufficient documentation

## 2011-10-27 DIAGNOSIS — D490 Neoplasm of unspecified behavior of digestive system: Secondary | ICD-10-CM

## 2011-10-27 DIAGNOSIS — Z79899 Other long term (current) drug therapy: Secondary | ICD-10-CM | POA: Insufficient documentation

## 2011-10-27 DIAGNOSIS — Z7902 Long term (current) use of antithrombotics/antiplatelets: Secondary | ICD-10-CM | POA: Insufficient documentation

## 2011-10-27 NOTE — Progress Notes (Signed)
Please see the Nurse Progress Note in the MD Initial Consult Encounter for this patient. 

## 2011-10-27 NOTE — Progress Notes (Addendum)
Married, retired  Pt denies pain, fatigue, loss of appetite, unintentional wgt loss, nausea, but does report rectal bleeding noticed on tissue approx 5 days weekly x 2 mos, and  "fleshy tissue w/o stool from rectum"  x 2 mos.

## 2011-10-28 NOTE — Addendum Note (Signed)
Encounter addended by: Jonna Coup, MD on: 10/28/2011  4:15 PM<BR>     Documentation filed: Notes Section

## 2011-10-28 NOTE — Progress Notes (Signed)
Radiation Oncology         [phone]) [phone] ________________________________  Name: Brandon Mora MRN: [id number]  Date: 10/27/2011  DOB: Jan 22, 1945  WU:[id number], Brandon Cheadle, MD  Lenora Boys, MD   Herbert Moors, MD   Avel Peace, MD  REFERRING PHYSICIAN: Herbert Moors, MD  DIAGNOSIS: The primary encounter diagnosis was Rectal adenoma. A diagnosis of Rectal tumor was also pertinent to this visit.   HISTORY OF PRESENT ILLNESS::Brandon Mora is a 66 y.o. male who is seen for an initial consultation visit. The patient is seen today regarding a recent finding of a tubulovillous adenoma within the rectum. The patient indicates that he has had some intermittent rectal bleeding for approximately 6 months. He has never had a colonoscopy but he did undergo further workup with this persistent symptom. A colonoscopy revealed a rectal mass which represented an approximate 4 cm firm rectal mass which was non-circumstantial and located predominantly on the left. Biopsies were taken and superficial fragments revealed a tubulovillous adenoma with high-grade glandular dysplasia. No clear invasive disease was seen.  The patient has been seen by Dr. Suzzanne Mora who has recommended resection of this. However, the patient has had coronary angioplasty and he is on Plavix at this time as well as with aspirin. Given the timing of this and his need to stay on Plavix, the patient is being planned to undergo resection of this tumor in January, 2014. The patient is concerned somewhat about this delay in surgery, although he understands the rationale of this, and therefore he has asked to be seen and evaluated by me today in clinic.   He relates no other new complaints. He is not having significant problems with constipation and no increase in bleeding. He states at this time that he notices occasional blood on the tissue but not on an everyday basis.   PREVIOUS RADIATION THERAPY: No   PAST MEDICAL HISTORY:  has a past  medical history of Hyperlipidemia; Hypertension; CAD (coronary artery disease); Rectal adenoma (10/11/11); Melanoma (2004); Depression; Angina at rest; Vertigo; Rectal bleeding (10/2011); and Heart attack (07/05/11).     PAST SURGICAL HISTORY: Past Surgical History  Procedure Date  . Finger surgery     r finger amputation  . Cardiac catheterization 2012    x2  . Coronary angioplasty with stent placement 2012, 2013    10/2010, 07/06/11     FAMILY HISTORY: family history includes Heart disease in his father and Hypertension in his mother.   SOCIAL HISTORY:  reports that he has never smoked. He does not have any smokeless tobacco history on file. He reports that he does not drink alcohol or use illicit drugs.   ALLERGIES: Lipitor; Statins; and Sertraline hcl   MEDICATIONS:  Current Outpatient Prescriptions  Medication Sig Dispense Refill  . aspirin 81 MG tablet Take 81 mg by mouth daily.      . clopidogrel (PLAVIX) 75 MG tablet daily.      Marland Kitchen co-enzyme Q-10 30 MG capsule Take 30 mg by mouth 3 (three) times daily.      . Cyanocobalamin (VITAMIN B 12 PO) Take by mouth.      . isosorbide mononitrate (IMDUR) 30 MG 24 hr tablet daily.      Marland Kitchen lisinopril (PRINIVIL,ZESTRIL) 30 MG tablet daily.      . metoprolol succinate (TOPROL-XL) 25 MG 24 hr tablet daily.      . Multiple Vitamin (MULTIVITAMIN) tablet Take 1 tablet by mouth daily.      Marland Kitchen  Pitavastatin Calcium (LIVALO) 2 MG TABS Take 2 mg by mouth daily.      Gilman Schmidt TEST test strip       . venlafaxine XR (EFFEXOR-XR) 75 MG 24 hr capsule daily.      Marland Kitchen ZETIA 10 MG tablet daily.         REVIEW OF SYSTEMS:  A 15 point review of systems is documented in the electronic medical record. This was obtained by the nursing staff. However, I reviewed this with the patient to discuss relevant findings and make appropriate changes.  Pertinent items are noted in HPI.    PHYSICAL EXAM:  height is 5\' 7"  (1.702 m) and weight is 178 lb (80.74 kg). His  oral temperature is 98 F (36.7 C). His blood pressure is 130/69 and his pulse is 56. His respiration is 20.   General: Well-developed, in no acute distress HEENT: Normocephalic, atraumatic; oral cavity clear Neck: Supple without any lymphadenopathy Cardiovascular: Regular rate and rhythm Respiratory: Clear to auscultation bilaterally GI: Soft, nontender, normal bowel sounds Extremities: No edema present Neuro: No focal deficits Rectal: No external findings. A firm exophytic mass present within the left rectal wall. A small amount of blood was present on the exam glove. This appears to be present approximately 4-6 cm proximal to the anal verge.    LABORATORY DATA:  Lab Results  Component Value Date   WBC 7.3 10/13/2010   HGB 13.2 10/13/2010   HCT 40.2 10/13/2010   MCV 90.3 10/13/2010   PLT 190 10/13/2010   Lab Results  Component Value Date   NA 140 10/13/2010   K 4.6 10/13/2010   CL 106 10/13/2010   CO2 24 10/13/2010   No results found for this basename: ALT, AST, GGT, ALKPHOS, BILITOT      RADIOGRAPHY: No results found.     IMPRESSION: The patient is a 66 year old male with a recent diagnosis of a tubulovillous adenoma with high-grade dysplasia involving a rectal tumor at approximately the 6 cm March proximal to the anal verge. The patient has no known invasive disease at this time.   PLAN:  The patient was primarily interested in further discussion of possible treatment options given the delay in resection due to his ongoing need for Plavix at this time.  I believe that the issues related to this delay are primarily twofold: The patient's concern regarding malignant transformation as well as the possibility of increased local effects in the interval time period.   With regards to the first issue, I discussed the typical timeframe of development of a malignancy such as rectal cancer. Although somewhat subtle optimal, I am not concerned about significant transformation  between now and his scheduled surgery in January. I believe that the greater issue is that of a possible increased or development of local issues. My suspicion is that this will not substantially changed although this is possible and I discussed in detail with the patient the likely issues to watch out for which include increased bleeding, and obstructive symptoms as well as related pain. The patient's level of bleeding at this time appears to be satisfactory to follow this and wait for a suitable time for resection as Dr. Abbey Chatters as planned for.  I therefore had a detailed discussion with the patient and his wife. The patient's wife has a brother who did have lung cancer which appeared to progress very rapidly and this I believe contributed to their anxiety about the situation and a delay. We discussed that his  situation is quite different. I therefore do not recommend anything additionally at this point, but the patient knows to contact our office if we can be of any further assistance. Certainly, if a more advanced/invasive tumor is seen after surgery then anticipated then the issue of postoperative radiation could be  Considered if necessary.   I spent 60 minutes minutes face to face with the patient and more than 50% of that time was spent in counseling and/or coordination of care.    ________________________________   Radene Gunning, MD, PhD

## 2011-10-28 NOTE — Addendum Note (Signed)
Encounter addended by: Delynn Flavin, RN on: 10/28/2011  7:24 PM<BR>     Documentation filed: Charges VN

## 2011-12-07 ENCOUNTER — Encounter (INDEPENDENT_AMBULATORY_CARE_PROVIDER_SITE_OTHER): Payer: Self-pay | Admitting: General Surgery

## 2011-12-07 ENCOUNTER — Ambulatory Visit (INDEPENDENT_AMBULATORY_CARE_PROVIDER_SITE_OTHER): Payer: Medicare Other | Admitting: General Surgery

## 2011-12-07 VITALS — BP 146/78 | HR 76 | Temp 97.9°F | Resp 18 | Ht 71.0 in | Wt 178.5 lb

## 2011-12-07 DIAGNOSIS — D128 Benign neoplasm of rectum: Secondary | ICD-10-CM

## 2011-12-07 DIAGNOSIS — D129 Benign neoplasm of anus and anal canal: Secondary | ICD-10-CM

## 2011-12-07 MED ORDER — TAMSULOSIN HCL 0.4 MG PO CAPS: 0.4000 mg | ORAL_CAPSULE | Freq: Every day | ORAL | Status: DC

## 2011-12-07 NOTE — Progress Notes (Signed)
Patient ID: Brandon Mora, male   DOB: July 09, 1945, 66 y.o.   MRN: [id number]  Chief Complaint  Patient presents with  . Results    Discuss sx    HPI Brandon Mora is a 66 y.o. male.   HPI  He is here for a preoperative visit. It's been 5 months since his coronary artery stent was placed.  The plan was to schedule the operation in January, 6 months after the stent placement so he could stop his Plavix.  Past Medical History  Diagnosis Date  . Hyperlipidemia   . Hypertension   . CAD (coronary artery disease)   . Rectal adenoma 10/11/11    tubulovillous adenoma w/dysplasia  . Melanoma 2004    left cheek  . Depression   . Angina at rest   . Vertigo   . Rectal bleeding 10/2011    6 mos hx  . Heart attack 07/05/11    Past Surgical History  Procedure Date  . Finger surgery     r finger amputation  . Cardiac catheterization 2012    x2  . Coronary angioplasty with stent placement 2012, 2013    10/2010, 07/06/11    Family History  Problem Relation Age of Onset  . Heart disease Father   . Hypertension Mother     Social History History  Substance Use Topics  . Smoking status: Never Smoker   . Smokeless tobacco: Not on file  . Alcohol Use: No    Allergies  Allergen Reactions  . Lipitor (Atorvastatin)     weakness  . Statins     Muscle weakness  . Sertraline Hcl Rash    Current Outpatient Prescriptions  Medication Sig Dispense Refill  . NITROSTAT 0.4 MG SL tablet as needed.      Marland Kitchen aspirin 81 MG tablet Take 81 mg by mouth daily.      . clopidogrel (PLAVIX) 75 MG tablet daily.      Marland Kitchen co-enzyme Q-10 30 MG capsule Take 30 mg by mouth 3 (three) times daily.      . Cyanocobalamin (VITAMIN B 12 PO) Take by mouth.      . isosorbide mononitrate (IMDUR) 30 MG 24 hr tablet daily.      Marland Kitchen lisinopril (PRINIVIL,ZESTRIL) 30 MG tablet daily.      . metoprolol succinate (TOPROL-XL) 25 MG 24 hr tablet daily.      . Multiple Vitamin (MULTIVITAMIN) tablet Take 1 tablet by mouth daily.       . Pitavastatin Calcium (LIVALO) 2 MG TABS Take 2 mg by mouth daily.      . Tamsulosin HCl (FLOMAX) 0.4 MG CAPS Take 1 capsule (0.4 mg total) by mouth daily.  7 capsule  0  . TRUETRACK TEST test strip       . venlafaxine XR (EFFEXOR-XR) 75 MG 24 hr capsule daily.      Marland Kitchen ZETIA 10 MG tablet daily.        Review of Systems Review of Systems  Constitutional: Negative.   Genitourinary: Positive for frequency.    Blood pressure 146/78, pulse 76, temperature 97.9 F (36.6 C), temperature source Temporal, resp. rate 18, height 5\' 11"  (1.803 m), weight 178 lb 8 oz (80.967 kg).  Physical Exam Physical Exam  Constitutional: He appears well-developed and well-nourished. No distress.  Abdominal: Soft.  Genitourinary:       No inguinal adenopathy.  Digital rectal exam-tumor palpable on the left side and does not feel any larger  Data Reviewed Old notes  Assessment    Tubulovillous adenoma of the rectum with high grade glandular dysplasia. Status post coronary artery stent placement 5 months ago. Does have some BPH type symptoms.    Plan    Transanal excision of rectal adenoma in January. Stop Plavix 7 days before operation. Continue aspirin. Perioperative Flomax to prevent urinary retention.  I have explained the procedure and risks.  Risks include but are not limited to bleeding, infection, wound problems, anesthesia, rectal leak, need for colostomy.  He and his wife seem to understand and agree with the plan.      Dawson Albers J 12/07/2011, 12:34 PM

## 2011-12-07 NOTE — Patient Instructions (Signed)
[facility]  ONE-DAY (1) PRE-OP HOME COLON PREP INSTRUCTIONS: ** MIRALAX / GATORADE PREP **  STOP PLAVIX 7 DAYS BEFORE SURGERY.  Start Flomax 2 days prior to surgery and then resume it for 5 days after surgery.   Fill the two prescriptions at a pharmacy of your choice.  You must follow the instructions below carefully.  If you have questions or problems, please call and speak to someone in the clinic department at our office:   [phone].  MIRALAX - GATORADE -- DULCOLAX TABS:   Fill the prescriptions for MIRALAX  (255 gm bottle)    In addition, purchase four (4) DULCOLAX TABLETS (no prescription required), and one 64 oz GATORADE.  (Do NOT purchase red Gatorade; any other flavor is acceptable). INSTRUCTIONS: 1. Five days prior to your procedure do not eat nuts, popcorn, or fruit with seeds.  Stop all fiber supplements such as Metamucil, Citrucel, etc.  2. The day before your procedure: o 6:00am:  take (4) Dulcolax tablets.  You should remain on clear liquids for the entire day.   CLEAR LIQUIDS: clear bouillon, broth, jello (NOT RED), black coffee, tea, soda, etc o 10:00am:  add the bottle of MiraLax to the 64-oz bottle of Gatorade, and dissolve.  Begin drinking the Gatorade mixture until gone (8 oz every 15-30 minutes).  Continue clear liquids until midnight (or bedtime). o   3. The day of your procedure:   Do not eat or drink ANYTHING after midnight before your surgery.     If you take Heart or Blood Pressure medicine, ask the pre-op nurses about these during your preop appointment.   Further pre-operative instructions will be given to you from the hospital.   Expect to be contacted 5-7 days before your surgery.

## 2012-01-02 ENCOUNTER — Encounter (HOSPITAL_COMMUNITY): Payer: Self-pay | Admitting: Pharmacy Technician

## 2012-01-05 ENCOUNTER — Encounter (HOSPITAL_COMMUNITY)
Admission: RE | Admit: 2012-01-05 | Discharge: 2012-01-05 | Disposition: A | Discharge status: Home / Self Care | Payer: Medicare Other | Source: Ambulatory Visit | Attending: General Surgery | Admitting: General Surgery

## 2012-01-05 ENCOUNTER — Ambulatory Visit (HOSPITAL_COMMUNITY)
Admission: RE | Admit: 2012-01-05 | Discharge: 2012-01-05 | Disposition: A | Discharge status: Home / Self Care | Payer: Medicare Other | Source: Ambulatory Visit | Attending: General Surgery | Admitting: General Surgery

## 2012-01-05 ENCOUNTER — Encounter (HOSPITAL_COMMUNITY): Payer: Self-pay

## 2012-01-05 DIAGNOSIS — I252 Old myocardial infarction: Secondary | ICD-10-CM | POA: Insufficient documentation

## 2012-01-05 DIAGNOSIS — I2510 Atherosclerotic heart disease of native coronary artery without angina pectoris: Secondary | ICD-10-CM | POA: Insufficient documentation

## 2012-01-05 DIAGNOSIS — Z01811 Encounter for preprocedural respiratory examination: Secondary | ICD-10-CM | POA: Insufficient documentation

## 2012-01-05 DIAGNOSIS — D128 Benign neoplasm of rectum: Secondary | ICD-10-CM | POA: Insufficient documentation

## 2012-01-05 HISTORY — DX: Unspecified osteoarthritis, unspecified site: M19.90

## 2012-01-05 LAB — CBC WITH DIFFERENTIAL/PLATELET
Basophils Absolute: 0 10*3/uL (ref 0.0–0.1)
Basophils Relative: 0 % (ref 0–1)
Eosinophils Absolute: 0.2 10*3/uL (ref 0.0–0.7)
Eosinophils Relative: 3 % (ref 0–5)
HCT: 45 % (ref 39.0–52.0)
Hemoglobin: 15.1 g/dL (ref 13.0–17.0)
Lymphocytes Relative: 37 % (ref 12–46)
Lymphs Abs: 2.5 10*3/uL (ref 0.7–4.0)
MCH: 29.8 pg (ref 26.0–34.0)
MCHC: 33.6 g/dL (ref 30.0–36.0)
MCV: 88.8 fL (ref 78.0–100.0)
Monocytes Absolute: 0.5 10*3/uL (ref 0.1–1.0)
Monocytes Relative: 8 % (ref 3–12)
Neutro Abs: 3.5 10*3/uL (ref 1.7–7.7)
Neutrophils Relative %: 52 % (ref 43–77)
Platelets: 240 10*3/uL (ref 150–400)
RBC: 5.07 MIL/uL (ref 4.22–5.81)
RDW: 13.6 % (ref 11.5–15.5)
WBC: 6.7 10*3/uL (ref 4.0–10.5)

## 2012-01-05 LAB — PROTIME-INR
INR: 0.91 (ref 0.00–1.49)
Prothrombin Time: 12.2 seconds (ref 11.6–15.2)

## 2012-01-05 LAB — COMPREHENSIVE METABOLIC PANEL
ALT: 26 U/L (ref 0–53)
CO2: 28 mEq/L (ref 19–32)
Calcium: 9.5 mg/dL (ref 8.4–10.5)
Creatinine, Ser: 0.79 mg/dL (ref 0.50–1.35)
GFR calc Af Amer: >90 mL/min (ref >90)
GFR calc non Af Amer: >90 mL/min (ref >90)
Glucose, Bld: 86 mg/dL (ref 70–99)
Sodium: 138 mEq/L (ref 135–145)
Total Protein: 6.8 g/dL (ref 6.0–8.3)

## 2012-01-05 LAB — SURGICAL PCR SCREEN
MRSA, PCR: NEGATIVE
Staphylococcus aureus: NEGATIVE

## 2012-01-05 NOTE — Patient Instructions (Signed)
20      Your procedure is scheduled on:  Monday 01/09/2012  Report to [facility] at 0530  AM.  Call this number if you have problems the morning of surgery: [phone]   Remember:   Do not eat food or drink liquids after midnight!  Take these medicines the morning of surgery with A SIP OF WATER: Metoprolol, Effexor   Do not bring valuables to the hospital.  .  Leave suitcase in the car. After surgery it may be brought to your room.  For patients admitted to the hospital, checkout time is 11:00 AM the day of              Discharge.    Special Instructions: See [facility] Preparing  For Surgery Instruction Sheet. Do not wear jewelry, lotions powders, perfumes. Women do not shave legs or underarms for 12 hours before showers. Contacts, partial plates,                 or dentures may not be worn into surgery.                          Patients discharged the day of surgery will not be allowed to drive home.If going home the same day of surgery, must have someone stay with you first 24 hrs.at home and arrange for someone to drive you home from the              Hospital.              YOUR DRIVER ZO:[id number]   Please read over the following fact sheets that you were given: MRSA  INFORMATION,INCENTIVE SPIROMETRY SHEET, SLEEP APNEA SHEET                            Telford Nab.Tanvir Hipple,RN,BSN     [phone]                FAILURE TO FOLLOW THESE INSTRUCTIONS MAY RESULT IN                               CANCELLATION OF YOUR SURGERY!               Patient Signature:___________________________

## 2012-01-05 NOTE — Progress Notes (Signed)
Office note from Dr. Leeann Must 08/19/2011 on chart and EKG from [facility] 08/19/2011 on chart.

## 2012-01-05 NOTE — Progress Notes (Signed)
01/05/12 1145  OBSTRUCTIVE SLEEP APNEA  Have you ever been diagnosed with sleep apnea through a sleep study? No  Do you snore loudly (loud enough to be heard through closed doors)?  1  Do you often feel tired, fatigued, or sleepy during the daytime? 1  Has anyone observed you stop breathing during your sleep? 0  Do you have, or are you being treated for high blood pressure? 1  BMI more than 35 kg/m2? 0  Age over 69 years old? 1  Neck circumference greater than 40 cm/18 inches? 0  Gender: 1  Obstructive Sleep Apnea Score 5

## 2012-01-06 ENCOUNTER — Telehealth (INDEPENDENT_AMBULATORY_CARE_PROVIDER_SITE_OTHER): Payer: Self-pay | Admitting: General Surgery

## 2012-01-06 NOTE — Telephone Encounter (Signed)
Brandon Mora, calling from [facility], with a message for Dr. Abbey Chatters about pt's Plavix and ASA.  They have advised pt to continue his ASA 81 mg (not to stop it pre-op) and want Dr. Abbey Chatters to restart the Plavix sooner than 3 days post op if at all possible.  The pt's stents are barely 6 months old and they don't want to risk them closing down.

## 2012-01-08 MED ORDER — DEXTROSE 5 % IV SOLN
2.0000 g | INTRAVENOUS | Status: AC
  Administered 2012-01-09: 2 g via INTRAVENOUS
  Filled 2012-01-08: qty 2

## 2012-01-09 ENCOUNTER — Encounter (HOSPITAL_COMMUNITY)
Admission: RE | Disposition: A | Discharge status: Home / Self Care | Payer: Self-pay | Source: Ambulatory Visit | Attending: General Surgery

## 2012-01-09 ENCOUNTER — Encounter (HOSPITAL_COMMUNITY): Payer: Self-pay | Admitting: Registered Nurse

## 2012-01-09 ENCOUNTER — Ambulatory Visit (HOSPITAL_COMMUNITY)
Admission: RE | Admit: 2012-01-09 | Discharge: 2012-01-10 | Disposition: A | Discharge status: Home / Self Care | Payer: Medicare Other | Source: Ambulatory Visit | Attending: General Surgery | Admitting: General Surgery

## 2012-01-09 ENCOUNTER — Ambulatory Visit (HOSPITAL_COMMUNITY): Payer: Medicare Other | Admitting: Registered Nurse

## 2012-01-09 ENCOUNTER — Encounter (HOSPITAL_COMMUNITY): Payer: Self-pay | Admitting: *Deleted

## 2012-01-09 DIAGNOSIS — I10 Essential (primary) hypertension: Secondary | ICD-10-CM | POA: Insufficient documentation

## 2012-01-09 DIAGNOSIS — Z79899 Other long term (current) drug therapy: Secondary | ICD-10-CM | POA: Insufficient documentation

## 2012-01-09 DIAGNOSIS — I2510 Atherosclerotic heart disease of native coronary artery without angina pectoris: Secondary | ICD-10-CM | POA: Insufficient documentation

## 2012-01-09 DIAGNOSIS — E785 Hyperlipidemia, unspecified: Secondary | ICD-10-CM | POA: Insufficient documentation

## 2012-01-09 DIAGNOSIS — C19 Malignant neoplasm of rectosigmoid junction: Secondary | ICD-10-CM | POA: Insufficient documentation

## 2012-01-09 DIAGNOSIS — C20 Malignant neoplasm of rectum: Secondary | ICD-10-CM

## 2012-01-09 DIAGNOSIS — I252 Old myocardial infarction: Secondary | ICD-10-CM | POA: Insufficient documentation

## 2012-01-09 DIAGNOSIS — D128 Benign neoplasm of rectum: Secondary | ICD-10-CM | POA: Insufficient documentation

## 2012-01-09 DIAGNOSIS — D129 Benign neoplasm of anus and anal canal: Secondary | ICD-10-CM | POA: Insufficient documentation

## 2012-01-09 HISTORY — PX: RECTAL VILLOUS ADENOMA EXCISION: SHX2313

## 2012-01-09 SURGERY — EXCISION, VILLOUS ADENOMA, ANAL APPROACH
Anesthesia: General | Site: Rectum | Wound class: Contaminated

## 2012-01-09 MED ORDER — EPHEDRINE SULFATE 50 MG/ML IJ SOLN
INTRAMUSCULAR | Status: DC | PRN
  Administered 2012-01-09: 10 mg via INTRAVENOUS

## 2012-01-09 MED ORDER — BIOTENE DRY MOUTH MT LIQD: 15.0000 mL | Freq: Two times a day (BID) | OROMUCOSAL | Status: DC

## 2012-01-09 MED ORDER — PROPOFOL 10 MG/ML IV BOLUS
INTRAVENOUS | Status: DC | PRN
  Administered 2012-01-09: 150 mg via INTRAVENOUS

## 2012-01-09 MED ORDER — METOPROLOL SUCCINATE ER 25 MG PO TB24
25.0000 mg | ORAL_TABLET | Freq: Every day | ORAL | Status: DC
  Administered 2012-01-10: 25 mg via ORAL
  Filled 2012-01-09 (×3): qty 1

## 2012-01-09 MED ORDER — NEOSTIGMINE METHYLSULFATE 1 MG/ML IJ SOLN
INTRAMUSCULAR | Status: DC | PRN
  Administered 2012-01-09: 3 mg via INTRAVENOUS

## 2012-01-09 MED ORDER — LIDOCAINE HCL (CARDIAC) 20 MG/ML IV SOLN
INTRAVENOUS | Status: DC | PRN
  Administered 2012-01-09: 100 mg via INTRAVENOUS

## 2012-01-09 MED ORDER — ONDANSETRON HCL 4 MG/2ML IJ SOLN
INTRAMUSCULAR | Status: DC | PRN
  Administered 2012-01-09: 4 mg via INTRAVENOUS

## 2012-01-09 MED ORDER — ONDANSETRON HCL 4 MG/2ML IJ SOLN: 4.0000 mg | Freq: Four times a day (QID) | INTRAMUSCULAR | Status: DC | PRN

## 2012-01-09 MED ORDER — LACTATED RINGERS IV SOLN
INTRAVENOUS | Status: DC | PRN
  Administered 2012-01-09: 07:00:00 via INTRAVENOUS

## 2012-01-09 MED ORDER — MORPHINE SULFATE 2 MG/ML IJ SOLN: 2.0000 mg | INTRAMUSCULAR | Status: DC | PRN

## 2012-01-09 MED ORDER — CEFOXITIN SODIUM-DEXTROSE 1-4 GM-% IV SOLR (PREMIX)
INTRAVENOUS | Status: AC
  Filled 2012-01-09: qty 100

## 2012-01-09 MED ORDER — BUPIVACAINE HCL (PF) 0.5 % IJ SOLN
INTRAMUSCULAR | Status: AC
  Filled 2012-01-09: qty 30

## 2012-01-09 MED ORDER — ACETAMINOPHEN 10 MG/ML IV SOLN
INTRAVENOUS | Status: AC
  Filled 2012-01-09: qty 100

## 2012-01-09 MED ORDER — ONDANSETRON HCL 4 MG PO TABS: 4.0000 mg | ORAL_TABLET | Freq: Four times a day (QID) | ORAL | Status: DC | PRN

## 2012-01-09 MED ORDER — DEXAMETHASONE SODIUM PHOSPHATE 10 MG/ML IJ SOLN
INTRAMUSCULAR | Status: DC | PRN
  Administered 2012-01-09: 10 mg via INTRAVENOUS

## 2012-01-09 MED ORDER — PROMETHAZINE HCL 25 MG/ML IJ SOLN: 6.2500 mg | INTRAMUSCULAR | Status: DC | PRN

## 2012-01-09 MED ORDER — SUCCINYLCHOLINE CHLORIDE 20 MG/ML IJ SOLN
INTRAMUSCULAR | Status: DC | PRN
  Administered 2012-01-09: 100 mg via INTRAVENOUS

## 2012-01-09 MED ORDER — BUPIVACAINE-EPINEPHRINE 0.5% -1:200000 IJ SOLN
INTRAMUSCULAR | Status: DC | PRN
  Administered 2012-01-09: 10 mL

## 2012-01-09 MED ORDER — KCL-LACTATED RINGERS-D5W 20 MEQ/L IV SOLN
INTRAVENOUS | Status: DC
  Administered 2012-01-09: 75 mL via INTRAVENOUS
  Filled 2012-01-09 (×3): qty 1000

## 2012-01-09 MED ORDER — BIOTENE DRY MOUTH MT LIQD
15.0000 mL | Freq: Two times a day (BID) | OROMUCOSAL | Status: DC
  Administered 2012-01-09 – 2012-01-10 (×2): 15 mL via OROMUCOSAL

## 2012-01-09 MED ORDER — LISINOPRIL 20 MG PO TABS
20.0000 mg | ORAL_TABLET | Freq: Every day | ORAL | Status: DC
  Administered 2012-01-10: 20 mg via ORAL
  Filled 2012-01-09 (×2): qty 1

## 2012-01-09 MED ORDER — ROCURONIUM BROMIDE 100 MG/10ML IV SOLN
INTRAVENOUS | Status: DC | PRN
  Administered 2012-01-09: 20 mg via INTRAVENOUS

## 2012-01-09 MED ORDER — GLYCOPYRROLATE 0.2 MG/ML IJ SOLN
INTRAMUSCULAR | Status: DC | PRN
  Administered 2012-01-09: .6 mg via INTRAVENOUS

## 2012-01-09 MED ORDER — METOPROLOL SUCCINATE ER 25 MG PO TB24
25.0000 mg | ORAL_TABLET | Freq: Once | ORAL | Status: AC
  Administered 2012-01-09: 25 mg via ORAL
  Filled 2012-01-09: qty 1

## 2012-01-09 MED ORDER — HEMOSTATIC AGENTS (NO CHARGE) OPTIME
TOPICAL | Status: DC | PRN
  Administered 2012-01-09: 1 via TOPICAL

## 2012-01-09 MED ORDER — BUPIVACAINE-EPINEPHRINE (PF) 0.5% -1:200000 IJ SOLN
INTRAMUSCULAR | Status: AC
  Filled 2012-01-09: qty 10

## 2012-01-09 MED ORDER — DEXTROSE 5 % IV SOLN
1.0000 g | Freq: Four times a day (QID) | INTRAVENOUS | Status: AC
  Administered 2012-01-09 – 2012-01-10 (×3): 1 g via INTRAVENOUS
  Filled 2012-01-09 (×4): qty 1

## 2012-01-09 MED ORDER — ISOSORBIDE MONONITRATE ER 30 MG PO TB24
30.0000 mg | ORAL_TABLET | Freq: Every day | ORAL | Status: DC
  Administered 2012-01-10: 30 mg via ORAL
  Filled 2012-01-09: qty 1

## 2012-01-09 MED ORDER — OXYCODONE HCL 5 MG PO TABS
5.0000 mg | ORAL_TABLET | ORAL | Status: DC | PRN
  Administered 2012-01-10: 10 mg via ORAL
  Filled 2012-01-09 (×2): qty 1

## 2012-01-09 MED ORDER — ACETAMINOPHEN 10 MG/ML IV SOLN
INTRAVENOUS | Status: DC | PRN
  Administered 2012-01-09: 1000 mg via INTRAVENOUS

## 2012-01-09 MED ORDER — 0.9 % SODIUM CHLORIDE (POUR BTL) OPTIME
TOPICAL | Status: DC | PRN
  Administered 2012-01-09: 1000 mL

## 2012-01-09 MED ORDER — FENTANYL CITRATE 0.05 MG/ML IJ SOLN
INTRAMUSCULAR | Status: DC | PRN
  Administered 2012-01-09 (×2): 50 ug via INTRAVENOUS
  Administered 2012-01-09: 150 ug via INTRAVENOUS

## 2012-01-09 MED ORDER — HYDROMORPHONE HCL PF 1 MG/ML IJ SOLN: 0.2500 mg | INTRAMUSCULAR | Status: DC | PRN

## 2012-01-09 MED ORDER — MIDAZOLAM HCL 5 MG/5ML IJ SOLN
INTRAMUSCULAR | Status: DC | PRN
  Administered 2012-01-09: 2 mg via INTRAVENOUS

## 2012-01-09 MED ORDER — CHLORHEXIDINE GLUCONATE 0.12 % MT SOLN
15.0000 mL | Freq: Two times a day (BID) | OROMUCOSAL | Status: DC
  Administered 2012-01-09 – 2012-01-10 (×2): 15 mL via OROMUCOSAL
  Filled 2012-01-09 (×4): qty 15

## 2012-01-09 MED ORDER — DOCUSATE SODIUM 100 MG PO CAPS
100.0000 mg | ORAL_CAPSULE | Freq: Two times a day (BID) | ORAL | Status: DC
  Administered 2012-01-09 – 2012-01-10 (×3): 100 mg via ORAL
  Filled 2012-01-09 (×4): qty 1

## 2012-01-09 MED ORDER — TAMSULOSIN HCL 0.4 MG PO CAPS
0.4000 mg | ORAL_CAPSULE | Freq: Every day | ORAL | Status: DC
  Administered 2012-01-10: 0.4 mg via ORAL
  Filled 2012-01-09: qty 1

## 2012-01-09 MED ORDER — NITROGLYCERIN 0.4 MG SL SUBL: 0.4000 mg | SUBLINGUAL_TABLET | SUBLINGUAL | Status: DC | PRN

## 2012-01-09 SURGICAL SUPPLY — 34 items
BLADE EXTENDED COATED 6.5IN (ELECTRODE) IMPLANT
BLADE HEX COATED 2.75 (ELECTRODE) ×2 IMPLANT
BLADE SURG 15 STRL LF DISP TIS (BLADE) ×1 IMPLANT
BLADE SURG 15 STRL SS (BLADE) ×1
CLOTH BEACON ORANGE TIMEOUT ST (SAFETY) ×2 IMPLANT
COVER MAYO STAND STRL (DRAPES) ×2 IMPLANT
DECANTER SPIKE VIAL GLASS SM (MISCELLANEOUS) ×2 IMPLANT
DRAPE LG THREE QUARTER DISP (DRAPES) ×2 IMPLANT
DRSG PAD ABDOMINAL 8X10 ST (GAUZE/BANDAGES/DRESSINGS) ×2 IMPLANT
ELECT REM PT RETURN 9FT ADLT (ELECTROSURGICAL) ×2
ELECTRODE REM PT RTRN 9FT ADLT (ELECTROSURGICAL) ×1 IMPLANT
GAUZE SPONGE 4X4 16PLY XRAY LF (GAUZE/BANDAGES/DRESSINGS) ×4 IMPLANT
GLOVE BIOGEL PI IND STRL 7.0 (GLOVE) ×1 IMPLANT
GLOVE BIOGEL PI INDICATOR 7.0 (GLOVE) ×1
GLOVE ECLIPSE 8.0 STRL XLNG CF (GLOVE) ×2 IMPLANT
GLOVE INDICATOR 8.0 STRL GRN (GLOVE) ×4 IMPLANT
GOWN STRL NON-REIN LRG LVL3 (GOWN DISPOSABLE) ×2 IMPLANT
GOWN STRL REIN XL XLG (GOWN DISPOSABLE) ×4 IMPLANT
KIT BASIN OR (CUSTOM PROCEDURE TRAY) ×2 IMPLANT
NEEDLE HYPO 22GX1.5 SAFETY (NEEDLE) ×2 IMPLANT
NS IRRIG 1000ML POUR BTL (IV SOLUTION) ×2 IMPLANT
PACK LITHOTOMY IV (CUSTOM PROCEDURE TRAY) ×2 IMPLANT
PENCIL BUTTON HOLSTER BLD 10FT (ELECTRODE) ×2 IMPLANT
SHEARS FOC LG CVD HARMONIC 17C (MISCELLANEOUS) ×2 IMPLANT
SPONGE GAUZE 4X4 12PLY (GAUZE/BANDAGES/DRESSINGS) ×2 IMPLANT
SPONGE LAP 18X18 X RAY DECT (DISPOSABLE) ×2 IMPLANT
SPONGE SURGIFOAM ABS GEL 100 (HEMOSTASIS) ×2 IMPLANT
SUT VIC AB 0 CT1 27 (SUTURE)
SUT VIC AB 0 CT1 27XCR 8 STRN (SUTURE) IMPLANT
SUT VIC AB 2-0 SH 18 (SUTURE) ×2 IMPLANT
SYR CONTROL 10ML LL (SYRINGE) ×2 IMPLANT
TAPE CLOTH SURG 4X10 WHT LF (GAUZE/BANDAGES/DRESSINGS) ×2 IMPLANT
TOWEL OR 17X26 10 PK STRL BLUE (TOWEL DISPOSABLE) ×4 IMPLANT
YANKAUER SUCT BULB TIP 10FT TU (MISCELLANEOUS) ×2 IMPLANT

## 2012-01-09 NOTE — Anesthesia Postprocedure Evaluation (Signed)
  Anesthesia Post-op Note  Patient: Brandon Mora  Procedure(s) Performed: Procedure(s) (LRB): TRANSANAL EXCISION OF VILLOUS ADENOMA (N/A)  Patient Location: PACU  Anesthesia Type: General  Level of Consciousness: awake and alert   Airway and Oxygen Therapy: Patient Spontanous Breathing  Post-op Pain: mild  Post-op Assessment: Post-op Vital signs reviewed, Patient's Cardiovascular Status Stable, Respiratory Function Stable, Patent Airway and No signs of Nausea or vomiting  Last Vitals:  Filed Vitals:   01/09/12 0855  BP: 158/73  Pulse: 72  Temp: 36.4 C  Resp: 16    Post-op Vital Signs: stable   Complications: No apparent anesthesia complications

## 2012-01-09 NOTE — Op Note (Signed)
Operative Note  Brandon Mora male 67 y.o. 01/09/2012  PREOPERATIVE DX:  Adenomatous rectal polyp with high grade glandular dysplasia  POSTOPERATIVE DX:  Same  PROCEDURE:  Transanal excision of adenomatous rectal polyp and rigid proctosigmoidoscopy         Surgeon: Adolph Pollack   Assistants: None  Anesthesia: General endotracheal anesthesia  Indications:   This is a 67 year old male who had been having some intermittent rectal bleeding. He had never had a colonoscopy. He underwent a colonoscopy in the fall of 2013 demonstrating a fairly large tubulovillous adenomatous polyp with high grade glandular dysplasia that cannot be removed completely by way of colonoscopy. He had a drug eluding stent placed 3 months prior to that procedure.  It is now felt to be safe to hold some of his antiplatelet agents to proceed with the operation so he presents for that.    Procedure Detail:  He was seen in the holding area and brought to the operating room and a general anesthetic was administered in the supine position. He then was turned in the prone position area he was placed on the operating room table in the prone position. The perianal area was sterilely prepped and draped.  A 15 cm proctoscope was inserted into the anus and the large polypoid mass was identified in the left rectal area approximately 6 cm from the anal verge. It encompassed about 20-25% of the circumference of the rectum. Using electrocautery and the harmonic scalpel the polypoid mass was excised. Edges of the excision were cauterized. The wound was hemostatic.  No obvious full-thickness rectal perforation was noted.  Rigid proctosigmoidoscopy was performed and the rectal area was patent. Marcaine with epinephrine was injected at the site for local anesthetic effect. A Gelfoam pack was then placed into the rectum followed by dry dressing.  He tolerated the procedure well without any apparent complications and was extubated and  taken to the recovery room in satisfactory condition.  Estimated Blood Loss:  less than 100 mL         Drains: none  Blood Given: none          Specimens: Rectal polypoid mass        Complications:  * No complications entered in OR log *         Disposition: PACU - hemodynamically stable.         Condition: stable

## 2012-01-09 NOTE — H&P (Signed)
Brandon Mora is an 67 y.o. male.   Chief Complaint:   Here for elective surgery HPI:   He has a large rectal adenomatous polyp that cannot be removed via colonoscopy.  He has a coronary stent placed last year and now is able to stop his anti-platelet agents to have transanal excision.  Past Medical History  Diagnosis Date  . Hyperlipidemia   . Hypertension   . CAD (coronary artery disease)   . Rectal adenoma 10/11/11    tubulovillous adenoma w/dysplasia  . Melanoma 2004    left cheek  . Depression   . Angina at rest   . Vertigo   . Rectal bleeding 10/2011    6 mos hx  . Heart attack 07/05/11  . Arthritis     Past Surgical History  Procedure Date  . Finger surgery     r finger amputation  . Cardiac catheterization 2012    x2  . Coronary angioplasty with stent placement 2012, 2013    10/2010, 07/06/11  . Tonsillectomy     as child    Family History  Problem Relation Age of Onset  . Heart disease Father   . Hypertension Mother    Social History:  reports that he has never smoked. He does not have any smokeless tobacco history on file. He reports that he does not drink alcohol or use illicit drugs.  Allergies:  Allergies  Allergen Reactions  . Lipitor (Atorvastatin)     weakness  . Statins     Muscle weakness  . Sertraline Hcl Rash    Medications Prior to Admission  Medication Sig Dispense Refill  . acetaminophen (TYLENOL) 500 MG tablet Take 500-1,000 mg by mouth every 6 (six) hours as needed. Pain      . aspirin 81 MG tablet Take 81 mg by mouth daily.      Marland Kitchen co-enzyme Q-10 30 MG capsule Take 30 mg by mouth 3 (three) times daily.      . cyanocobalamin 1000 MCG tablet Take 100 mcg by mouth daily.      Marland Kitchen glucosamine-chondroitin 500-400 MG tablet Take 1 tablet by mouth daily.      . isosorbide mononitrate (IMDUR) 30 MG 24 hr tablet Take 30 mg by mouth daily.      Marland Kitchen lisinopril (PRINIVIL,ZESTRIL) 20 MG tablet Take 20 mg by mouth daily before breakfast.      . metoprolol  succinate (TOPROL-XL) 25 MG 24 hr tablet Take 25 mg by mouth daily before breakfast.       . Multiple Vitamin (MULTIVITAMIN) tablet Take 1 tablet by mouth daily.      . Omega-3 Fatty Acids (FISH OIL) 1200 MG CAPS Take 1 capsule by mouth daily.      . Pitavastatin Calcium (LIVALO) 2 MG TABS Take 2 mg by mouth daily before breakfast.       . Saw Palmetto, Serenoa repens, (SAW PALMETTO PO) Take 4 tablets by mouth daily.      . Tamsulosin HCl (FLOMAX) 0.4 MG CAPS Take 1 capsule (0.4 mg total) by mouth daily.  7 capsule  0  . venlafaxine XR (EFFEXOR-XR) 75 MG 24 hr capsule Take 75 mg by mouth daily.       Marland Kitchen ZETIA 10 MG tablet Take 10 mg by mouth daily before breakfast.       . clopidogrel (PLAVIX) 75 MG tablet Take 75 mg by mouth daily before breakfast.       . NITROSTAT 0.4 MG SL tablet  Place 0.4 mg under the tongue as needed.         No results found for this or any previous visit (from the past 48 hour(s)). No results found.  Review of Systems  Constitutional: Negative.   Gastrointestinal: Negative.     Blood pressure 160/80, pulse 69, temperature 97.5 F (36.4 C), resp. rate 20, SpO2 98.00%. Physical Exam  Constitutional: He appears well-developed and well-nourished. No distress.  HENT:  Head: Normocephalic and atraumatic.  Eyes: EOM are normal. No scleral icterus.  GI: Soft. There is no tenderness.  Genitourinary:       Left sided mass.  Neurological: He is alert.  Psychiatric: He has a normal mood and affect.     Assessment/Plan Large rectal adenomatous polyp  Plan:  Transanal excision.  The procedure and risks have been discussed with him.  Ajna Moors J 01/09/2012, 7:34 AM

## 2012-01-09 NOTE — Transfer of Care (Signed)
Immediate Anesthesia Transfer of Care Note  Patient: Brandon Mora  Procedure(s) Performed: Procedure(s) (LRB) with comments: TRANSANAL EXCISION OF VILLOUS ADENOMA (N/A) - Transanal Excision of Rectal Adenoma  with proctoscopy  Patient Location: PACU  Anesthesia Type:General  Level of Consciousness: awake, alert , oriented and patient cooperative  Airway & Oxygen Therapy: Patient Spontanous Breathing and Patient connected to face mask oxygen  Post-op Assessment: Report given to PACU RN, Post -op Vital signs reviewed and stable and Patient moving all extremities  Post vital signs: Reviewed and stable  Complications: No apparent anesthesia complications

## 2012-01-09 NOTE — Anesthesia Preprocedure Evaluation (Addendum)
Anesthesia Evaluation  Patient identified by MRN, date of birth, ID band Patient awake    Reviewed: Allergy & Precautions, H&P , NPO status , Patient's Chart, lab work & pertinent test results  Airway Mallampati: II TM Distance: <3 FB Neck ROM: Full    Dental No notable dental hx.    Pulmonary neg pulmonary ROS,  breath sounds clear to auscultation  Pulmonary exam normal       Cardiovascular hypertension, Pt. on medications + CAD, + Past MI and + Cardiac Stents Rhythm:Regular Rate:Normal     Neuro/Psych negative neurological ROS  negative psych ROS   GI/Hepatic negative GI ROS, Neg liver ROS,   Endo/Other  negative endocrine ROS  Renal/GU negative Renal ROS  negative genitourinary   Musculoskeletal negative musculoskeletal ROS (+)   Abdominal   Peds negative pediatric ROS (+)  Hematology negative hematology ROS (+)   Anesthesia Other Findings   Reproductive/Obstetrics negative OB ROS                          Anesthesia Physical Anesthesia Plan  ASA: III  Anesthesia Plan: General   Post-op Pain Management:    Induction: Intravenous  Airway Management Planned: LMA and Oral ETT  Additional Equipment:   Intra-op Plan:   Post-operative Plan: Extubation in OR  Informed Consent: I have reviewed the patients History and Physical, chart, labs and discussed the procedure including the risks, benefits and alternatives for the proposed anesthesia with the patient or authorized representative who has indicated his/her understanding and acceptance.   Dental advisory given  Plan Discussed with: CRNA and Surgeon  Anesthesia Plan Comments:         Anesthesia Quick Evaluation

## 2012-01-09 NOTE — Preoperative (Addendum)
Beta Blockers   Reason not to administer Beta Blockers:Metoprolol taken at 0630 at1-6-14

## 2012-01-09 NOTE — Interval H&P Note (Signed)
History and Physical Interval Note:  01/09/2012 7:37 AM  Brandon Mora  has presented today for surgery, with the diagnosis of rectal adenoma  The various methods of treatment have been discussed with the patient and family. After consideration of risks, benefits and other options for treatment, the patient has consented to  Procedure(s) (LRB) with comments: TRANSANAL EXCISION OF VILLOUS ADENOMA (N/A) - Transanal Excision of Rectal Adenoma as a surgical intervention .  The patient's history has been reviewed, patient examined, no change in status, stable for surgery.  I have reviewed the patient's chart and labs.  Questions were answered to the patient's satisfaction.     Ashyia Schraeder Shela [place]

## 2012-01-10 ENCOUNTER — Encounter (HOSPITAL_COMMUNITY): Payer: Self-pay | Admitting: General Surgery

## 2012-01-10 MED ORDER — CIPROFLOXACIN HCL 500 MG PO TABS: 500.0000 mg | ORAL_TABLET | Freq: Two times a day (BID) | ORAL | Status: DC

## 2012-01-10 MED ORDER — ASPIRIN EC 81 MG PO TBEC
81.0000 mg | DELAYED_RELEASE_TABLET | Freq: Every day | ORAL | Status: DC
  Administered 2012-01-10: 81 mg via ORAL
  Filled 2012-01-10: qty 1

## 2012-01-10 MED ORDER — METRONIDAZOLE 500 MG PO TABS: 500.0000 mg | ORAL_TABLET | Freq: Three times a day (TID) | ORAL | Status: DC

## 2012-01-10 NOTE — Progress Notes (Signed)
Dr. Abbey Chatters aware via phone pt has ambulated in hall and that recent temp check was 99.8. Md to come see pt. No new orders.

## 2012-01-10 NOTE — Progress Notes (Signed)
1 Day Post-Op  Subjective: No pain.  Had some liquid BMs with light blood on tissue paper with wiping.  Some fever.   Objective: Vital signs in last 24 hours: Temp:  [97.5 F (36.4 C)-100.6 F (38.1 C)] 98.8 F (37.1 C) (01/07 0528) Pulse Rate:  [52-72] 57  (01/07 0528) Resp:  [3-20] 18  (01/07 0528) BP: (94-158)/(52-73) 118/59 mmHg (01/07 0528) SpO2:  [91 %-100 %] 99 % (01/07 0528) Weight:  [176 lb (79.833 kg)] 176 lb (79.833 kg) (01/06 1300) Last BM Date: 01/09/12  Intake/Output from previous day: 01/06 0701 - 01/07 0700 In: 2168.8 [P.O.:240; I.V.:1928.8] Out: 775 [Urine:750; Blood:25] Intake/Output this shift:    PE: General- In NAD  Lungs-bibasilar crackles Abdomen-soft, nontender  Anorectal-no blood on pad  Lab Results:  No results found for this basename: WBC:2,HGB:2,HCT:2,PLT:2 in the last 72 hours BMET No results found for this basename: NA:2,K:2,CL:2,CO2:2,GLUCOSE:2,BUN:2,CREATININE:2,CALCIUM:2 in the last 72 hours PT/INR No results found for this basename: LABPROT:2,INR:2 in the last 72 hours Comprehensive Metabolic Panel:    Component Value Date/Time   NA 138 01/05/2012 1030   K 5.0 01/05/2012 1030   CL 103 01/05/2012 1030   CO2 28 01/05/2012 1030   BUN 20 01/05/2012 1030   CREATININE 0.79 01/05/2012 1030   GLUCOSE 86 01/05/2012 1030   CALCIUM 9.5 01/05/2012 1030   AST 21 01/05/2012 1030   ALT 26 01/05/2012 1030   ALKPHOS 68 01/05/2012 1030   BILITOT 0.2* 01/05/2012 1030   PROT 6.8 01/05/2012 1030   ALBUMIN 3.6 01/05/2012 1030     Studies/Results: No results found.  Anti-infectives: Anti-infectives     Start     Dose/Rate Route Frequency Ordered Stop   01/09/12 1400   cefOXitin (MEFOXIN) 1 g in dextrose 5 % 50 mL IVPB        1 g 100 mL/hr over 30 Minutes Intravenous Every 6 hours 01/09/12 1107 01/10/12 0223   01/09/12 0600   cefOXitin (MEFOXIN) 2 g in dextrose 5 % 50 mL IVPB        2 g 100 mL/hr over 30 Minutes Intravenous On call to O.R. 01/08/12 1558 01/09/12  0755          Assessment Post transanal excision of large rectal tubulovillous adenoma-some low grade fever which may be secondary to atelectasis.    LOS: 1 day   Plan: Complete perioperative antibiotics.  Ambulate in hall.  Restart aspirin.  Discharge later today if temperature stays down.  Discharge instructions given.   Kharson Rasmusson J 01/10/2012

## 2012-01-10 NOTE — Progress Notes (Signed)
Dr. Luisa Hart (Pathologist) called me and informed me that the specimen had invasive carcinoma in it going down to the muscularis propria.   One of the margins appeared to be positive as well.  I have explained this to him and his wife.  Will discharge him today on empiric antibiotics given the depth of the resection and call him with further evaluation and treatment plans after his case is discussed tomorrow at the multi-disciplinary GI cancer conference.

## 2012-01-10 NOTE — Progress Notes (Signed)
Assessment unchanged. Pt and wife verbalized understanding of dc instructions through teach back. No scripts. Introduced to My Chart with verbalized understanding. Pt able to tell nurse how to sign up for My Chart. Pt discharged via wc to front entrance to meet awaiting vehicle to carry home. Accompanied by wife and and NT.

## 2012-01-11 ENCOUNTER — Encounter (INDEPENDENT_AMBULATORY_CARE_PROVIDER_SITE_OTHER): Payer: Self-pay | Admitting: General Surgery

## 2012-01-11 ENCOUNTER — Telehealth: Payer: Self-pay | Admitting: *Deleted

## 2012-01-11 ENCOUNTER — Other Ambulatory Visit (INDEPENDENT_AMBULATORY_CARE_PROVIDER_SITE_OTHER): Payer: Self-pay | Admitting: General Surgery

## 2012-01-11 DIAGNOSIS — C20 Malignant neoplasm of rectum: Secondary | ICD-10-CM

## 2012-01-11 NOTE — Progress Notes (Unsigned)
Patient ID: Brandon Mora, male   DOB: January 20, 1945, 67 y.o.   MRN: [id number] Discussed further evaluation and treatment recommendations with him and his wife.  Plan is to obtain CT scans of the chest, abdomen, and pelvis.  Will also refer to Medical Oncology for neoadjuvant chemotherapy.  He had a fever last night (POD #1), but his temperature is normal now and he is feeling better.

## 2012-01-12 ENCOUNTER — Telehealth: Payer: Self-pay | Admitting: Oncology

## 2012-01-12 ENCOUNTER — Telehealth: Payer: Self-pay | Admitting: *Deleted

## 2012-01-12 ENCOUNTER — Other Ambulatory Visit (INDEPENDENT_AMBULATORY_CARE_PROVIDER_SITE_OTHER): Payer: Self-pay | Admitting: General Surgery

## 2012-01-12 DIAGNOSIS — C20 Malignant neoplasm of rectum: Secondary | ICD-10-CM

## 2012-01-12 NOTE — Telephone Encounter (Signed)
Pt schedule w/Dr. Truett Perna 01/24 @ 1:30,  Welcome packet mailed.

## 2012-01-12 NOTE — Telephone Encounter (Signed)
C/D 01/12/12 for appt.01/27/12

## 2012-01-12 NOTE — Telephone Encounter (Signed)
Spoke with patient and his wife by phone and confirmed appointments with Dr. Mitzi Hansen on 1/13 and Dr. Truett Perna on 1/24.  Date, time, and location of appointments were verified along with physician contact names and phone numbers.  Patient and wife were without questions.

## 2012-01-13 ENCOUNTER — Other Ambulatory Visit: Payer: Medicare Other

## 2012-01-13 ENCOUNTER — Inpatient Hospital Stay: Admission: RE | Admit: 2012-01-13 | Payer: Medicare Other | Source: Ambulatory Visit

## 2012-01-13 ENCOUNTER — Ambulatory Visit
Admission: RE | Admit: 2012-01-13 | Discharge: 2012-01-13 | Disposition: A | Discharge status: Home / Self Care | Payer: Medicare Other | Source: Ambulatory Visit | Attending: General Surgery | Admitting: General Surgery

## 2012-01-13 ENCOUNTER — Encounter (INDEPENDENT_AMBULATORY_CARE_PROVIDER_SITE_OTHER): Payer: Self-pay | Admitting: General Surgery

## 2012-01-13 DIAGNOSIS — C20 Malignant neoplasm of rectum: Secondary | ICD-10-CM

## 2012-01-13 MED ORDER — IOHEXOL 300 MG/ML  SOLN
100.0000 mL | Freq: Once | INTRAMUSCULAR | Status: AC | PRN
  Administered 2012-01-13: 100 mL via INTRAVENOUS

## 2012-01-13 NOTE — Progress Notes (Signed)
Patient ID: Brandon Mora, male   DOB: 1945-06-03, 67 y.o.   MRN: [id number] Results of the CT scan of the chest, abdomen, and pelvis were reviewed with him and his wife. No obvious evidence of metastatic disease exists. There are slightly prominent lymph nodes around the area of resection which could be reactive versus metastatic disease.  There are some locules of free air around the area of resection the rectum and retroperitoneum and small areas around the liver. He however feels fine with no fever and no pain.  Will proceed with neoadjuvant chemotherapy as planned with timing of the start determined after he sees the oncologist.

## 2012-01-15 ENCOUNTER — Telehealth (INDEPENDENT_AMBULATORY_CARE_PROVIDER_SITE_OTHER): Payer: Self-pay | Admitting: General Surgery

## 2012-01-15 NOTE — Anesthesia Preprocedure Evaluation (Signed)
Anesthesia Evaluation  Patient identified by MRN, date of birth, ID band Patient awake    Reviewed: Allergy & Precautions, H&P , NPO status , Patient's Chart, lab work & pertinent test results  Airway Mallampati: II TM Distance: <3 FB Neck ROM: Full    Dental No notable dental hx.    Pulmonary neg pulmonary ROS,  breath sounds clear to auscultation  Pulmonary exam normal       Cardiovascular hypertension, Pt. on medications + CAD, + Past MI and + Cardiac Stents Rhythm:Regular Rate:Normal     Neuro/Psych negative neurological ROS  negative psych ROS   GI/Hepatic negative GI ROS, Neg liver ROS,   Endo/Other  negative endocrine ROS  Renal/GU negative Renal ROS  negative genitourinary   Musculoskeletal negative musculoskeletal ROS (+)   Abdominal   Peds  Hematology negative hematology ROS (+)   Anesthesia Other Findings   Reproductive/Obstetrics negative OB ROS                           Anesthesia Physical Anesthesia Plan  ASA: III and emergent  Anesthesia Plan: General   Post-op Pain Management:    Induction: Intravenous  Airway Management Planned: Oral ETT  Additional Equipment:   Intra-op Plan:   Post-operative Plan: Extubation in OR  Informed Consent: I have reviewed the patients History and Physical, chart, labs and discussed the procedure including the risks, benefits and alternatives for the proposed anesthesia with the patient or authorized representative who has indicated his/her understanding and acceptance.   Dental advisory given  Plan Discussed with: CRNA  Anesthesia Plan Comments:         Anesthesia Quick Evaluation

## 2012-01-16 ENCOUNTER — Inpatient Hospital Stay (HOSPITAL_COMMUNITY)
Admission: EM | Admit: 2012-01-16 | Discharge: 2012-01-19 | DRG: 908 | Disposition: A | Discharge status: Home / Self Care | Payer: Medicare Other | Attending: General Surgery | Admitting: General Surgery

## 2012-01-16 ENCOUNTER — Emergency Department (HOSPITAL_COMMUNITY): Payer: Medicare Other | Admitting: *Deleted

## 2012-01-16 ENCOUNTER — Encounter (HOSPITAL_COMMUNITY): Payer: Self-pay | Admitting: *Deleted

## 2012-01-16 ENCOUNTER — Encounter (HOSPITAL_COMMUNITY): Payer: Self-pay | Admitting: Anesthesiology

## 2012-01-16 ENCOUNTER — Ambulatory Visit
Admission: RE | Admit: 2012-01-16 | Discharge: 2012-01-16 | Disposition: A | Discharge status: Home / Self Care | Payer: Medicare Other | Source: Ambulatory Visit | Attending: Radiation Oncology | Admitting: Radiation Oncology

## 2012-01-16 ENCOUNTER — Ambulatory Visit: Payer: Medicare Other

## 2012-01-16 ENCOUNTER — Encounter (HOSPITAL_COMMUNITY)
Admission: EM | Disposition: A | Discharge status: Home / Self Care | Payer: Self-pay | Source: Home / Self Care | Attending: General Surgery

## 2012-01-16 DIAGNOSIS — I252 Old myocardial infarction: Secondary | ICD-10-CM

## 2012-01-16 DIAGNOSIS — D128 Benign neoplasm of rectum: Secondary | ICD-10-CM | POA: Diagnosis present

## 2012-01-16 DIAGNOSIS — R5381 Other malaise: Secondary | ICD-10-CM | POA: Diagnosis present

## 2012-01-16 DIAGNOSIS — IMO0002 Reserved for concepts with insufficient information to code with codable children: Secondary | ICD-10-CM

## 2012-01-16 DIAGNOSIS — I2510 Atherosclerotic heart disease of native coronary artery without angina pectoris: Secondary | ICD-10-CM

## 2012-01-16 DIAGNOSIS — Y838 Other surgical procedures as the cause of abnormal reaction of the patient, or of later complication, without mention of misadventure at the time of the procedure: Secondary | ICD-10-CM | POA: Diagnosis present

## 2012-01-16 DIAGNOSIS — F329 Major depressive disorder, single episode, unspecified: Secondary | ICD-10-CM | POA: Diagnosis present

## 2012-01-16 DIAGNOSIS — E785 Hyperlipidemia, unspecified: Secondary | ICD-10-CM | POA: Diagnosis present

## 2012-01-16 DIAGNOSIS — K921 Melena: Secondary | ICD-10-CM

## 2012-01-16 DIAGNOSIS — F3289 Other specified depressive episodes: Secondary | ICD-10-CM | POA: Diagnosis present

## 2012-01-16 DIAGNOSIS — D490 Neoplasm of unspecified behavior of digestive system: Secondary | ICD-10-CM

## 2012-01-16 DIAGNOSIS — I10 Essential (primary) hypertension: Secondary | ICD-10-CM | POA: Diagnosis present

## 2012-01-16 DIAGNOSIS — C20 Malignant neoplasm of rectum: Secondary | ICD-10-CM | POA: Diagnosis present

## 2012-01-16 DIAGNOSIS — D62 Acute posthemorrhagic anemia: Secondary | ICD-10-CM | POA: Diagnosis present

## 2012-01-16 HISTORY — PX: EXAMINATION UNDER ANESTHESIA: SHX1540

## 2012-01-16 HISTORY — DX: Anxiety disorder, unspecified: F41.9

## 2012-01-16 HISTORY — PX: PROCTOSCOPY: SHX2266

## 2012-01-16 HISTORY — DX: Malignant neoplasm of rectum: C20

## 2012-01-16 LAB — POCT I-STAT, CHEM 8
Calcium, Ion: 1.1 mmol/L — ABNORMAL LOW (ref 1.13–1.30)
Glucose, Bld: 109 mg/dL — ABNORMAL HIGH (ref 70–99)
HCT: 43 % (ref 39.0–52.0)
Hemoglobin: 14.6 g/dL (ref 13.0–17.0)
TCO2: 26 mmol/L (ref 0–100)

## 2012-01-16 LAB — CBC
MCH: 29.8 pg (ref 26.0–34.0)
MCV: 89.3 fL (ref 78.0–100.0)
Platelets: 297 10*3/uL (ref 150–400)
RBC: 4.2 MIL/uL — ABNORMAL LOW (ref 4.22–5.81)
RDW: 13.6 % (ref 11.5–15.5)
WBC: 10.4 10*3/uL (ref 4.0–10.5)

## 2012-01-16 LAB — CBC WITH DIFFERENTIAL/PLATELET
Basophils Absolute: 0 10*3/uL (ref 0.0–0.1)
Eosinophils Absolute: 0.3 10*3/uL (ref 0.0–0.7)
Eosinophils Relative: 4 % (ref 0–5)
MCH: 30 pg (ref 26.0–34.0)
MCV: 89.3 fL (ref 78.0–100.0)
Neutrophils Relative %: 51 % (ref 43–77)
Platelets: 256 10*3/uL (ref 150–400)
RDW: 13.9 % (ref 11.5–15.5)
WBC: 8.2 10*3/uL (ref 4.0–10.5)

## 2012-01-16 LAB — TYPE AND SCREEN
ABO/RH(D): A POS
Antibody Screen: NEGATIVE

## 2012-01-16 LAB — BASIC METABOLIC PANEL
BUN: 18 mg/dL (ref 6–23)
Calcium: 8.5 mg/dL (ref 8.4–10.5)
Creatinine, Ser: 0.8 mg/dL (ref 0.50–1.35)
GFR calc non Af Amer: 90 mL/min — ABNORMAL LOW (ref >90)
Glucose, Bld: 108 mg/dL — ABNORMAL HIGH (ref 70–99)

## 2012-01-16 SURGERY — EXAM UNDER ANESTHESIA
Anesthesia: General | Site: Rectum | Wound class: Dirty or Infected

## 2012-01-16 MED ORDER — LISINOPRIL 20 MG PO TABS
20.0000 mg | ORAL_TABLET | Freq: Every day | ORAL | Status: DC
Start: 2012-01-16 — End: 2012-01-19
  Administered 2012-01-16 – 2012-01-19 (×4): 20 mg via ORAL
  Filled 2012-01-16 (×4): qty 1

## 2012-01-16 MED ORDER — METOCLOPRAMIDE HCL 5 MG/ML IJ SOLN
INTRAMUSCULAR | Status: DC | PRN
  Administered 2012-01-16: 10 mg via INTRAVENOUS

## 2012-01-16 MED ORDER — NITROGLYCERIN 0.4 MG SL SUBL: 0.4000 mg | SUBLINGUAL_TABLET | SUBLINGUAL | Status: DC | PRN

## 2012-01-16 MED ORDER — ONDANSETRON HCL 4 MG/2ML IJ SOLN: 4.0000 mg | Freq: Four times a day (QID) | INTRAMUSCULAR | Status: DC | PRN

## 2012-01-16 MED ORDER — 0.9 % SODIUM CHLORIDE (POUR BTL) OPTIME
TOPICAL | Status: DC | PRN
  Administered 2012-01-16: 1000 mL

## 2012-01-16 MED ORDER — GLYCOPYRROLATE 0.2 MG/ML IJ SOLN
INTRAMUSCULAR | Status: DC | PRN
  Administered 2012-01-16: 0.6 mg via INTRAVENOUS

## 2012-01-16 MED ORDER — DEXTROSE 5 % IV SOLN
2.0000 g | INTRAVENOUS | Status: DC | PRN
  Administered 2012-01-16: 2 g via INTRAVENOUS

## 2012-01-16 MED ORDER — ONDANSETRON HCL 4 MG/2ML IJ SOLN
INTRAMUSCULAR | Status: DC | PRN
  Administered 2012-01-16: 4 mg via INTRAVENOUS

## 2012-01-16 MED ORDER — METOPROLOL SUCCINATE ER 25 MG PO TB24
25.0000 mg | ORAL_TABLET | Freq: Every day | ORAL | Status: DC
  Administered 2012-01-17 – 2012-01-19 (×3): 25 mg via ORAL
  Filled 2012-01-16 (×3): qty 1

## 2012-01-16 MED ORDER — SODIUM CHLORIDE 0.9 % IV SOLN
INTRAVENOUS | Status: DC
  Administered 2012-01-16: 04:00:00 via INTRAVENOUS

## 2012-01-16 MED ORDER — PROPOFOL 10 MG/ML IV BOLUS
INTRAVENOUS | Status: DC | PRN
  Administered 2012-01-16: 50 mg via INTRAVENOUS
  Administered 2012-01-16: 100 mg via INTRAVENOUS
  Administered 2012-01-16: 130 mg via INTRAVENOUS

## 2012-01-16 MED ORDER — LACTATED RINGERS IV SOLN
INTRAVENOUS | Status: DC | PRN
  Administered 2012-01-16 (×3): via INTRAVENOUS

## 2012-01-16 MED ORDER — TAMSULOSIN HCL 0.4 MG PO CAPS
0.4000 mg | ORAL_CAPSULE | Freq: Every day | ORAL | Status: DC
Start: 2012-01-16 — End: 2012-01-19
  Administered 2012-01-16 – 2012-01-19 (×4): 0.4 mg via ORAL
  Filled 2012-01-16 (×4): qty 1

## 2012-01-16 MED ORDER — ISOSORBIDE MONONITRATE ER 30 MG PO TB24
30.0000 mg | ORAL_TABLET | Freq: Every day | ORAL | Status: DC
  Administered 2012-01-16 – 2012-01-19 (×4): 30 mg via ORAL
  Filled 2012-01-16 (×4): qty 1

## 2012-01-16 MED ORDER — DOCUSATE SODIUM 100 MG PO CAPS
100.0000 mg | ORAL_CAPSULE | Freq: Every day | ORAL | Status: DC
  Administered 2012-01-18: 100 mg via ORAL
  Filled 2012-01-16 (×3): qty 1

## 2012-01-16 MED ORDER — OXYCODONE-ACETAMINOPHEN 5-325 MG PO TABS
1.0000 | ORAL_TABLET | ORAL | Status: DC | PRN
  Administered 2012-01-16 – 2012-01-17 (×4): 2 via ORAL
  Filled 2012-01-16 (×4): qty 2

## 2012-01-16 MED ORDER — DEXTROSE 5 % IV SOLN
2.0000 g | Freq: Four times a day (QID) | INTRAVENOUS | Status: DC
  Administered 2012-01-16 – 2012-01-19 (×13): 2 g via INTRAVENOUS
  Filled 2012-01-16 (×14): qty 2

## 2012-01-16 MED ORDER — MORPHINE SULFATE 2 MG/ML IJ SOLN: 2.0000 mg | INTRAMUSCULAR | Status: DC | PRN

## 2012-01-16 MED ORDER — DEXAMETHASONE SODIUM PHOSPHATE 4 MG/ML IJ SOLN
INTRAMUSCULAR | Status: DC | PRN
  Administered 2012-01-16: 10 mg via INTRAVENOUS

## 2012-01-16 MED ORDER — ZOLPIDEM TARTRATE 5 MG PO TABS
5.0000 mg | ORAL_TABLET | Freq: Once | ORAL | Status: AC
  Administered 2012-01-16: 5 mg via ORAL
  Filled 2012-01-16: qty 1

## 2012-01-16 MED ORDER — DOCUSATE SODIUM 100 MG PO CAPS
100.0000 mg | ORAL_CAPSULE | Freq: Two times a day (BID) | ORAL | Status: DC
  Filled 2012-01-16 (×2): qty 1

## 2012-01-16 MED ORDER — ONDANSETRON HCL 4 MG PO TABS
4.0000 mg | ORAL_TABLET | Freq: Four times a day (QID) | ORAL | Status: DC | PRN
  Administered 2012-01-16: 4 mg via ORAL
  Filled 2012-01-16: qty 1

## 2012-01-16 MED ORDER — ROCURONIUM BROMIDE 100 MG/10ML IV SOLN
INTRAVENOUS | Status: DC | PRN
  Administered 2012-01-16: 20 mg via INTRAVENOUS

## 2012-01-16 MED ORDER — BIOTENE DRY MOUTH MT LIQD
15.0000 mL | Freq: Two times a day (BID) | OROMUCOSAL | Status: DC
  Administered 2012-01-16 – 2012-01-18 (×6): 15 mL via OROMUCOSAL

## 2012-01-16 MED ORDER — KCL-LACTATED RINGERS-D5W 20 MEQ/L IV SOLN
INTRAVENOUS | Status: DC
  Administered 2012-01-16: 22:00:00 via INTRAVENOUS
  Administered 2012-01-16: 1000 mL via INTRAVENOUS
  Administered 2012-01-17 – 2012-01-19 (×3): via INTRAVENOUS
  Filled 2012-01-16 (×7): qty 1000

## 2012-01-16 MED ORDER — NEOSTIGMINE METHYLSULFATE 1 MG/ML IJ SOLN
INTRAMUSCULAR | Status: DC | PRN
  Administered 2012-01-16: 5 mg via INTRAVENOUS

## 2012-01-16 MED ORDER — THROMBIN 5000 UNITS EX SOLR
OROMUCOSAL | Status: DC | PRN
  Administered 2012-01-16 (×2): via TOPICAL

## 2012-01-16 MED ORDER — HYDROMORPHONE HCL PF 1 MG/ML IJ SOLN: 0.2500 mg | INTRAMUSCULAR | Status: DC | PRN

## 2012-01-16 MED ORDER — LACTATED RINGERS IV SOLN: INTRAVENOUS | Status: DC

## 2012-01-16 MED ORDER — METOPROLOL SUCCINATE ER 25 MG PO TB24
25.0000 mg | ORAL_TABLET | Freq: Every day | ORAL | Status: DC
  Administered 2012-01-16: 25 mg via ORAL
  Filled 2012-01-16: qty 1

## 2012-01-16 MED ORDER — FENTANYL CITRATE 0.05 MG/ML IJ SOLN
INTRAMUSCULAR | Status: DC | PRN
  Administered 2012-01-16: 50 ug via INTRAVENOUS
  Administered 2012-01-16: 25 ug via INTRAVENOUS
  Administered 2012-01-16: 50 ug via INTRAVENOUS
  Administered 2012-01-16: 75 ug via INTRAVENOUS
  Administered 2012-01-16: 50 ug via INTRAVENOUS

## 2012-01-16 MED ORDER — SUCCINYLCHOLINE CHLORIDE 20 MG/ML IJ SOLN
INTRAMUSCULAR | Status: DC | PRN
  Administered 2012-01-16: 60 mg via INTRAVENOUS
  Administered 2012-01-16: 100 mg via INTRAVENOUS

## 2012-01-16 MED ORDER — VENLAFAXINE HCL ER 75 MG PO CP24
75.0000 mg | ORAL_CAPSULE | Freq: Every day | ORAL | Status: DC
  Administered 2012-01-16 – 2012-01-19 (×4): 75 mg via ORAL
  Filled 2012-01-16 (×4): qty 1

## 2012-01-16 MED ORDER — CHLORHEXIDINE GLUCONATE 0.12 % MT SOLN
15.0000 mL | Freq: Two times a day (BID) | OROMUCOSAL | Status: DC
  Administered 2012-01-16 – 2012-01-18 (×6): 15 mL via OROMUCOSAL
  Filled 2012-01-16 (×8): qty 15

## 2012-01-16 MED ORDER — EPHEDRINE SULFATE 50 MG/ML IJ SOLN
INTRAMUSCULAR | Status: DC | PRN
  Administered 2012-01-16: 5 mg via INTRAVENOUS
  Administered 2012-01-16: 10 mg via INTRAVENOUS

## 2012-01-16 MED ORDER — PROMETHAZINE HCL 25 MG/ML IJ SOLN: 6.2500 mg | INTRAMUSCULAR | Status: DC | PRN

## 2012-01-16 SURGICAL SUPPLY — 36 items
BLADE HEX COATED 2.75 (ELECTRODE) ×2 IMPLANT
BLADE SURG 15 STRL LF DISP TIS (BLADE) ×1 IMPLANT
BLADE SURG 15 STRL SS (BLADE) ×1
CLOTH BEACON ORANGE TIMEOUT ST (SAFETY) ×2 IMPLANT
COVER MAYO STAND STRL (DRAPES) ×2 IMPLANT
DECANTER SPIKE VIAL GLASS SM (MISCELLANEOUS) IMPLANT
DRAIN PENROSE 18X1/2 LTX STRL (DRAIN) IMPLANT
DRAPE TABLE BACK 44X90 PK DISP (DRAPES) ×2 IMPLANT
DRSG PAD ABDOMINAL 8X10 ST (GAUZE/BANDAGES/DRESSINGS) ×4 IMPLANT
ELECT REM PT RETURN 9FT ADLT (ELECTROSURGICAL) ×2
ELECTRODE REM PT RTRN 9FT ADLT (ELECTROSURGICAL) ×1 IMPLANT
GAUZE SPONGE 4X4 16PLY XRAY LF (GAUZE/BANDAGES/DRESSINGS) ×2 IMPLANT
GLOVE BIOGEL PI IND STRL 7.0 (GLOVE) ×1 IMPLANT
GLOVE BIOGEL PI INDICATOR 7.0 (GLOVE) ×1
GLOVE ECLIPSE 8.0 STRL XLNG CF (GLOVE) ×2 IMPLANT
GLOVE INDICATOR 8.0 STRL GRN (GLOVE) ×4 IMPLANT
GOWN STRL NON-REIN LRG LVL3 (GOWN DISPOSABLE) ×2 IMPLANT
GOWN STRL REIN XL XLG (GOWN DISPOSABLE) ×4 IMPLANT
KIT BASIN OR (CUSTOM PROCEDURE TRAY) ×4 IMPLANT
LUBRICANT JELLY K Y 4OZ (MISCELLANEOUS) ×4 IMPLANT
NDL SAFETY ECLIPSE 18X1.5 (NEEDLE) ×1 IMPLANT
NEEDLE HYPO 18GX1.5 SHARP (NEEDLE) ×2
NEEDLE HYPO 25X1 1.5 SAFETY (NEEDLE) ×2 IMPLANT
NS IRRIG 1000ML POUR BTL (IV SOLUTION) ×2 IMPLANT
PACK LITHOTOMY IV (CUSTOM PROCEDURE TRAY) ×4 IMPLANT
PENCIL BUTTON HOLSTER BLD 10FT (ELECTRODE) ×2 IMPLANT
SPONGE GAUZE 4X4 12PLY (GAUZE/BANDAGES/DRESSINGS) ×2 IMPLANT
SPONGE SURGIFOAM ABS GEL 100 (HEMOSTASIS) ×4 IMPLANT
SPONGE SURGIFOAM ABS GEL 12-7 (HEMOSTASIS) IMPLANT
SUT CHROMIC 2 0 SH (SUTURE) IMPLANT
SUT CHROMIC 3 0 SH 27 (SUTURE) IMPLANT
SYR CONTROL 10ML LL (SYRINGE) IMPLANT
TAPE CLOTH SURG 4X10 WHT LF (GAUZE/BANDAGES/DRESSINGS) ×2 IMPLANT
TOWEL OR 17X26 10 PK STRL BLUE (TOWEL DISPOSABLE) ×2 IMPLANT
UNDERPAD 30X30 INCONTINENT (UNDERPADS AND DIAPERS) ×4 IMPLANT
YANKAUER SUCT BULB TIP 10FT TU (MISCELLANEOUS) ×4 IMPLANT

## 2012-01-16 NOTE — H&P (Signed)
Brandon Mora is an 67 y.o. male.   Chief Complaint: Rectal bleeding HPI: He underwent transanal excision of a rectal tumor one week ago. This turned out to be invasive rectal cancer. He was doing well until today when he began feeling weak and began having bloody bowel movements. His wife call me tonight and I instructed her to call 911 and have the ambulance bringing him to the hospital here at [facility]. Of note is that 7 months ago he had drug-eluting stents put in. His cardiologist did not want to stop his aspirin around the procedure but we were able to stop the Plavix. He began the Plavix 2 days postoperatively. He does not feel faint and has no abdominal pain.  Currently, he feels like he needs to have another bowel movement.  His wife is here with him.  Past Medical History  Diagnosis Date  . Hyperlipidemia   . Hypertension   . CAD (coronary artery disease)   . Rectal adenoma 10/11/11    tubulovillous adenoma w/dysplasia  . Melanoma 2004    left cheek  . Depression   . Angina at rest   . Vertigo   . Rectal bleeding 10/2011    6 mos hx  . Heart attack 07/05/11  . Arthritis     Past Surgical History  Procedure Date  . Finger surgery     r finger amputation  . Cardiac catheterization 2012    x2  . Coronary angioplasty with stent placement 2012, 2013    10/2010, 07/06/11  . Tonsillectomy     as child  . Rectal villous adenoma excision 01/09/2012    Procedure: TRANSANAL EXCISION OF VILLOUS ADENOMA;  Surgeon: Adolph Pollack, MD;  Location: [facility];  Service: General;  Laterality: N/A;  Transanal Excision of Rectal Adenoma  with proctoscopy    Family History  Problem Relation Age of Onset  . Heart disease Father   . Hypertension Mother    Social History:  reports that he has never smoked. He does not have any smokeless tobacco history on file. He reports that he does not drink alcohol or use illicit drugs.  Allergies:  Allergies  Allergen Reactions  . Lipitor  (Atorvastatin)     weakness  . Statins     Muscle weakness  . Sertraline Hcl Rash     (Not in a hospital admission)  No results found for this or any previous visit (from the past 48 hour(s)). No results found.  Review of Systems  Constitutional: Negative for fever and chills.  Gastrointestinal: Positive for blood in stool. Negative for abdominal pain.  Neurological: Negative for weakness.    Blood pressure 153/85, pulse 71, temperature 97.8 F (36.6 C), temperature source Oral, resp. rate 21, height 5\' 7"  (1.702 m), weight 170 lb (77.111 kg), SpO2 97.00%. Physical Exam  Constitutional: He appears well-developed and well-nourished. He appears distressed.  Eyes: No scleral icterus.  Cardiovascular: Normal rate and regular rhythm.   Respiratory: Effort normal and breath sounds normal.  GI: Soft. He exhibits no distension and no mass. There is no tenderness.     Assessment/Plan Rectal bleeding status post transanal excision of rectal tumor. Physical chance this is related to his antiplatelet agents. Before his first surgery, we have discussed his increased risk of bleeding.  Plan: We'll taken to the operating room for emergency examination under anesthesia and proctoscopy. The procedure and risks including need for further operations were discussed with him.  Temia Debroux J 01/16/2012, 12:20  AM    

## 2012-01-16 NOTE — ED Notes (Signed)
Pt is s/p rectal surgery x 5 days ago. Pt experiencing rectal bleeding x 3 episodes in past 4 hrs. Dr. Pollyann Kennedy @ [facility]. Pt last ate @ 2030.

## 2012-01-16 NOTE — ED Provider Notes (Signed)
Directly seen by Dr. Abbey Chatters.   Brandon Seamen, MD 01/16/12 [phone]

## 2012-01-16 NOTE — Progress Notes (Signed)
Day of Surgery  Subjective: Slept a little.  No pain.  Objective: Vital signs in last 24 hours: Temp:  [97.8 F (36.6 C)-99.1 F (37.3 C)] 99.1 F (37.3 C) (01/13 0542) Pulse Rate:  [71-85] 85  (01/13 0542) Resp:  [11-21] 11  (01/13 0542) BP: (131-160)/(65-85) 151/73 mmHg (01/13 0542) SpO2:  [93 %-100 %] 96 % (01/13 0542) Weight:  [170 lb (77.111 kg)-187 lb 6.3 oz (85 kg)] 187 lb 6.3 oz (85 kg) (01/13 0542) Last BM Date: 01/16/12  Intake/Output from previous day: 01/12 0701 - 01/13 0700 In: 3137 [I.V.:2825; Blood:262; IV Piggyback:50] Out: 800 [Urine:650; Blood:150] Intake/Output this shift:    PE: General- In NAD Abdomen-soft, nontender Anorectal-no bleeding Lab Results:   Basename 01/16/12 0024 01/16/12 0015  WBC -- 8.2  HGB 14.6 14.0  HCT 43.0 41.7  PLT -- 256   BMET  Basename 01/16/12 0024 01/16/12 0015  NA 139 134*  K 4.9 4.6  CL 105 101  CO2 -- 26  GLUCOSE 109* 108*  BUN 22 18  CREATININE 0.90 0.80  CALCIUM -- 8.5   PT/INR No results found for this basename: LABPROT:2,INR:2 in the last 72 hours Comprehensive Metabolic Panel:    Component Value Date/Time   NA 139 01/16/2012 0024   K 4.9 01/16/2012 0024   CL 105 01/16/2012 0024   CO2 26 01/16/2012 0015   BUN 22 01/16/2012 0024   CREATININE 0.90 01/16/2012 0024   GLUCOSE 109* 01/16/2012 0024   CALCIUM 8.5 01/16/2012 0015   AST 21 01/05/2012 1030   ALT 26 01/05/2012 1030   ALKPHOS 68 01/05/2012 1030   BILITOT 0.2* 01/05/2012 1030   PROT 6.8 01/05/2012 1030   ALBUMIN 3.6 01/05/2012 1030     Studies/Results: No results found.  Anti-infectives: Anti-infectives     Start     Dose/Rate Route Frequency Ordered Stop   01/16/12 0600   cefOXitin (MEFOXIN) 2 g in dextrose 5 % 50 mL IVPB        2 g 100 mL/hr over 30 Minutes Intravenous Every 6 hours 01/16/12 0552            Assessment Principal Problem:  *Rectal adenoma s/p transanal excision 01/09/12 (final pathology showed invasive rectal cancer) with delayed  postop bleed after restarting Plavix-no bleeding since being out of OR.   Active Problems:  CAD S/P percutaneous coronary angioplasty-Plavix and Aspirin held; he understands the possible risk of stent occlusion    LOS: 0 days   Plan: Bowel rest.  Bedrest. Check cbc. Hold Plavix and Aspirin.   Reyna Lorenzi J 01/16/2012

## 2012-01-16 NOTE — Anesthesia Postprocedure Evaluation (Signed)
  Anesthesia Post-op Note  Patient: Brandon Mora  Procedure(s) Performed: Procedure(s) (LRB) with comments: EXAM UNDER ANESTHESIA (N/A) - Controll of bleeding PROCTOSCOPY (N/A)  Patient Location: PACU  Anesthesia Type:General  Level of Consciousness: awake, alert , oriented, patient cooperative and responds to stimulation  Airway and Oxygen Therapy: Patient Spontanous Breathing and Patient connected to nasal cannula oxygen  Post-op Pain: mild  Post-op Assessment: Post-op Vital signs reviewed, Respiratory Function Stable, Patent Airway, No signs of Nausea or vomiting, Pain level controlled, No headache and No backache  Post-op Vital Signs: Reviewed and stable  Complications: No apparent anesthesia complications

## 2012-01-16 NOTE — Op Note (Signed)
Operative Note  Brandon Mora male 67 y.o. 01/16/2012  PREOPERATIVE DX:  Hematochezia 6 days status post transanal excision of rectal tumor  POSTOPERATIVE DX:  Same  PROCEDURE:  Emergency examination under anesthesia, proctoscopy, cauterization of bleeding areas in the rectum, Rigid proctosigmoidoscopy.         Surgeon: Adolph Pollack   Assistants: None  Anesthesia: General endotracheal anesthesia  Indications: This is a 67 year old male who underwent transanal excision of a left-sided 4 cm polypoid mass in the rectum. Final pathology demonstrated a 1.5 cm invasive cancer. He is on chronic aspirin and Plavix for a drug-eluting stents that was placed July 2013. He was doing well until his sixth postoperative day when he felt weak and then began having hematochezia. He had 3 episodes of hematochezia prior to arriving in the emergency department. He is now brought to the operating room for the above procedure.    Procedure Detail:  He was brought to the operating room placed supine on the operating table and general anesthetic was administered. He was placed in the lithotomy position. The perianal area were sterilely prepped and draped.  An anoscope was inserted and a moderate amount of blood as well as blood clots were evacuated. The proctoscope was inserted to allow me to visualize the bone on the left side. There were multiple areas around the wound there were oozing and electrocautery was applied to these areas. There is no specific blood vessel that was bleeding.  I then placed a dry pad in the wound. The pad was removed and I re-inspected the area and did not see any active bleeding.  I then placed pieces of Gelfoam soaked in thrombin in the wound. I waited 10 minutes and then re-inspected the area and there was no evidence of bleeding.  After he was extubated, he had a large bowel movement with liquid brown stool mixed with what appeared to be old blood. I had the anesthesiologist  readminister the general anesthetic and placed him back in the lithotomy position. A Foley catheter was inserted. The perianal area was again sterilely prepped and draped. The proctoscope was inserted and a fair amount of liquid brown stool mixed with old blood was noted. Rigid proctosigmoidoscopy was then performed to 20 cm and I saw old blood mixed with brown stool. There is no fresh blood. Some of the previous Gelfoam that had been placed came out so I replaced the Gelfoam. A bulky dressing was then placed over the anal area.  He subsequently was extubated and taken to the recovery room in satisfactory condition. There were no apparent complications. I suspect he will continue to pass some old blood.    Findings: There was oozing from multiple sites around the wound.  Estimated Blood Loss:  400 ml         Drains: none  Blood Given: none          Specimens: None        Complications:  * No complications entered in OR log *         Disposition: ICU - extubated and stable.         Condition: stable
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
## 2012-01-16 NOTE — Progress Notes (Signed)
HR 50s, asymptomatic, hemodynamically stable.    Hold Metoprolol for HR<60

## 2012-01-16 NOTE — Preoperative (Signed)
Beta Blockers   Reason not to administer Beta Blockers:Not Applicable, pt states he took BB around 9am 11/14/2012

## 2012-01-16 NOTE — Progress Notes (Signed)
[id number]/Leandrew Keech, RN, BSN, CCM: CHART REVIEWED AND UPDATED.  Next chart review due on 0162014. NO DISCHARGE NEEDS PRESENT AT THIS TIME. CASE MANAGEMENT [phone] 

## 2012-01-16 NOTE — Progress Notes (Signed)
This RN called the Blood Bank this am to confirm patient had, in fact received one unit of Platelets. I was relayed in report that it was infused in PACU, and the patient did accurately describe the appearance of the unit of platelets, but it was not found in the Blood Administration documentation.

## 2012-01-16 NOTE — Transfer of Care (Signed)
Immediate Anesthesia Transfer of Care Note  Patient: Brandon Mora  Procedure(s) Performed: Procedure(s) (LRB) with comments: EXAM UNDER ANESTHESIA (N/A) - Controll of bleeding PROCTOSCOPY (N/A)  Patient Location: PACU  Anesthesia Type:General  Level of Consciousness: awake, alert , oriented, patient cooperative and responds to stimulation  Airway & Oxygen Therapy: Patient connected to face mask oxygen  Post-op Assessment: Report given to PACU RN, Post -op Vital signs reviewed and stable and Patient moving all extremities X 4  Post vital signs: Reviewed and stable  Complications: No apparent anesthesia complications

## 2012-01-16 NOTE — Progress Notes (Signed)
Progress note I visited with the patient briefly today. He was scheduled to see me as an outpatient for consideration of neoadjuvant treatment in light of his pathology findings from his recent tumor excision. The patient's case was previously discussed at GI conference. However, the patient had some rectal bleeding, and was admitted for management of this.  I discussed the possibility of such a treatment briefly with the patient. I would like to see him in approximately one to one and a half weeks as an outpatient after he is discharged and after he has recovered some from this episode.  The patient's questions were answered and I look forward to seeing him next week.

## 2012-01-17 ENCOUNTER — Encounter (HOSPITAL_COMMUNITY): Payer: Self-pay | Admitting: General Surgery

## 2012-01-17 LAB — BASIC METABOLIC PANEL
CO2: 28 mEq/L (ref 19–32)
Chloride: 103 mEq/L (ref 96–112)
Creatinine, Ser: 0.8 mg/dL (ref 0.50–1.35)
Glucose, Bld: 110 mg/dL — ABNORMAL HIGH (ref 70–99)

## 2012-01-17 LAB — CBC
Hemoglobin: 11 g/dL — ABNORMAL LOW (ref 13.0–17.0)
MCV: 89.4 fL (ref 78.0–100.0)
Platelets: 248 10*3/uL (ref 150–400)
RBC: 3.67 MIL/uL — ABNORMAL LOW (ref 4.22–5.81)
WBC: 9.7 10*3/uL (ref 4.0–10.5)

## 2012-01-17 LAB — PREPARE PLATELET PHERESIS

## 2012-01-17 MED ORDER — ZOLPIDEM TARTRATE 5 MG PO TABS
5.0000 mg | ORAL_TABLET | Freq: Once | ORAL | Status: AC
  Administered 2012-01-17: 5 mg via ORAL
  Filled 2012-01-17: qty 1
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
## 2012-01-17 NOTE — Progress Notes (Signed)
At 0900 this am, after removing the Foley catheter in patient, this RN assisted patient OOB for the first time and assisted him to the recliner. The patient was able to tolerate sitting up for 2 hours, but needed to return to bed, as he was beginning to feel fatigued. He did express his desire to ambulate, but stated he was feeling 'worn out' at the moment, and perhaps if he feels better later, after some rest, he would try to ambulate.   Will continue to monitor, and assist patient as needed

## 2012-01-17 NOTE — Progress Notes (Signed)
1 Day Post-Op  Subjective: Slept well.  No pain.  No rectal bleeding.  Objective: Vital signs in last 24 hours: Temp:  [98 F (36.7 C)-99.5 F (37.5 C)] 98 F (36.7 C) (01/14 0400) Pulse Rate:  [51-96] 51  (01/14 0800) Resp:  [12-18] 12  (01/14 0800) BP: (101-132)/(43-64) 116/55 mmHg (01/14 0800) SpO2:  [90 %-98 %] 96 % (01/14 0800) Weight:  [184 lb 15.5 oz (83.9 kg)] 184 lb 15.5 oz (83.9 kg) (01/14 0000) Last BM Date: 01/16/12  Intake/Output from previous day: 01/13 0701 - 01/14 0700 In: 1375 [I.V.:1275; IV Piggyback:100] Out: 3000 [Urine:3000] Intake/Output this shift:    PE: General- In NAD Abdomen-soft, nontender, active bowel sounds Anorectal-no bleeding Lab Results:   Basename 01/17/12 0345 01/16/12 0832  WBC 9.7 10.4  HGB 11.0* 12.5*  HCT 32.8* 37.5*  PLT 248 297   BMET  Basename 01/17/12 0345 01/16/12 0024 01/16/12 0015  NA 137 139 --  K 4.1 4.9 --  CL 103 105 --  CO2 28 -- 26  GLUCOSE 110* 109* --  BUN 13 22 --  CREATININE 0.80 0.90 --  CALCIUM 8.5 -- 8.5   PT/INR No results found for this basename: LABPROT:2,INR:2 in the last 72 hours Comprehensive Metabolic Panel:    Component Value Date/Time   NA 137 01/17/2012 0345   K 4.1 01/17/2012 0345   CL 103 01/17/2012 0345   CO2 28 01/17/2012 0345   BUN 13 01/17/2012 0345   CREATININE 0.80 01/17/2012 0345   GLUCOSE 110* 01/17/2012 0345   CALCIUM 8.5 01/17/2012 0345   AST 21 01/05/2012 1030   ALT 26 01/05/2012 1030   ALKPHOS 68 01/05/2012 1030   BILITOT 0.2* 01/05/2012 1030   PROT 6.8 01/05/2012 1030   ALBUMIN 3.6 01/05/2012 1030     Studies/Results: No results found.  Anti-infectives: Anti-infectives     Start     Dose/Rate Route Frequency Ordered Stop   01/16/12 0600   cefOXitin (MEFOXIN) 2 g in dextrose 5 % 50 mL IVPB        2 g 100 mL/hr over 30 Minutes Intravenous Every 6 hours 01/16/12 0552            Assessment Principal Problem:  *Rectal adenoma s/p transanal excision 01/09/12 (final  pathology showed invasive rectal cancer) with delayed postop bleed after restarting Plavix-no bleeding since being out of OR.   Active Problems:  CAD S/P percutaneous coronary angioplasty-Plavix and Aspirin held;  I discussed this with his cardiologist Dr. Leeann Must and we decided to restart his Aspirin tomorrow but hold the Plavix for 2 weeks. ABL anemia-Hemoglobin 11 today.    LOS: 1 day   Plan: D/C foley.  Mobilize.  Full liquids.  Check cbc tomorrow.   Raynetta Osterloh J 01/17/2012

## 2012-01-18 LAB — CBC
HCT: 34.5 % — ABNORMAL LOW (ref 39.0–52.0)
MCH: 29.7 pg (ref 26.0–34.0)
MCV: 89.8 fL (ref 78.0–100.0)
RBC: 3.84 MIL/uL — ABNORMAL LOW (ref 4.22–5.81)
RDW: 14.1 % (ref 11.5–15.5)
WBC: 8.2 10*3/uL (ref 4.0–10.5)

## 2012-01-18 MED ORDER — ASPIRIN EC 81 MG PO TBEC
81.0000 mg | DELAYED_RELEASE_TABLET | Freq: Every day | ORAL | Status: DC
  Administered 2012-01-18 – 2012-01-19 (×2): 81 mg via ORAL
  Filled 2012-01-18 (×2): qty 1

## 2012-01-18 MED ORDER — ACETAMINOPHEN 325 MG PO TABS: 650.0000 mg | ORAL_TABLET | Freq: Four times a day (QID) | ORAL | Status: DC | PRN

## 2012-01-18 NOTE — Progress Notes (Signed)
Pt transferred to 5 [address] rm 1523. Left in wheelchair pushed by nurse tech. Left in good condition. Vwilliams, rn.

## 2012-01-18 NOTE — Progress Notes (Signed)
2 Days Post-Op  Subjective:   No pain.  No rectal bleeding.  Multiple loose BMs.  Walked in room and became fatigued.  Objective: Vital signs in last 24 hours: Temp:  [97.7 F (36.5 C)-99.3 F (37.4 C)] 98.7 F (37.1 C) (01/15 0800) Pulse Rate:  [55-72] 66  (01/15 0800) Resp:  [16-22] 18  (01/15 0800) BP: (114-144)/(51-72) 135/72 mmHg (01/15 0800) SpO2:  [18 %-97 %] 96 % (01/15 0800) Weight:  [181 lb 7 oz (82.3 kg)] 181 lb 7 oz (82.3 kg) (01/15 0400) Last BM Date: 01/17/12  Intake/Output from previous day: 01/14 0701 - 01/15 0700 In: 925 [I.V.:825; IV Piggyback:100] Out: 2200 [Urine:2200] Intake/Output this shift:    PE: General- In NAD Abdomen-soft, nontender, active bowel sounds Anorectal-no bleeding Lab Results:   Basename 01/18/12 0340 01/17/12 0345  WBC 8.2 9.7  HGB 11.4* 11.0*  HCT 34.5* 32.8*  PLT 265 248   BMET  Basename 01/17/12 0345 01/16/12 0024 01/16/12 0015  NA 137 139 --  K 4.1 4.9 --  CL 103 105 --  CO2 28 -- 26  GLUCOSE 110* 109* --  BUN 13 22 --  CREATININE 0.80 0.90 --  CALCIUM 8.5 -- 8.5   PT/INR No results found for this basename: LABPROT:2,INR:2 in the last 72 hours Comprehensive Metabolic Panel:    Component Value Date/Time   NA 137 01/17/2012 0345   K 4.1 01/17/2012 0345   CL 103 01/17/2012 0345   CO2 28 01/17/2012 0345   BUN 13 01/17/2012 0345   CREATININE 0.80 01/17/2012 0345   GLUCOSE 110* 01/17/2012 0345   CALCIUM 8.5 01/17/2012 0345   AST 21 01/05/2012 1030   ALT 26 01/05/2012 1030   ALKPHOS 68 01/05/2012 1030   BILITOT 0.2* 01/05/2012 1030   PROT 6.8 01/05/2012 1030   ALBUMIN 3.6 01/05/2012 1030     Studies/Results: No results found.  Anti-infectives: Anti-infectives     Start     Dose/Rate Route Frequency Ordered Stop   01/16/12 0600   cefOXitin (MEFOXIN) 2 g in dextrose 5 % 50 mL IVPB        2 g 100 mL/hr over 30 Minutes Intravenous Every 6 hours 01/16/12 0552            Assessment Principal Problem:  *Rectal adenoma  s/p transanal excision 01/09/12 (final pathology showed invasive rectal cancer) with delayed postop bleed after restarting Plavix-no bleeding since being out of OR; voiding okay; mildly deconditioned Active Problems:  CAD S/P percutaneous coronary angioplasty-Plavix and Aspirin held;  I discussed this with his cardiologist Dr. Leeann Must and we decided to  hold the Plavix for 2 weeks. ABL anemia-Hemoglobin 11.4 today.    LOS: 2 days   Plan:  Transfer to floor.  High fiber diet.  Restart Aspirin 81 mg daily.   Brandon Mora 01/18/2012
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
## 2012-01-19 MED ORDER — ZOLPIDEM TARTRATE 5 MG PO TABS
5.0000 mg | ORAL_TABLET | Freq: Every evening | ORAL | Status: DC | PRN
  Administered 2012-01-19: 5 mg via ORAL
  Filled 2012-01-19: qty 1

## 2012-01-19 NOTE — Progress Notes (Signed)
Patient reports seeing a few drops of bright red blood after he went to bathroom -scant amount bright red blood noted on toilet paper after he wiped.

## 2012-01-19 NOTE — Progress Notes (Signed)
3 Days Post-Op  Subjective:   Scant amount of red blood on tissue paper last night after a BM.  Had a BM this morning that was semi formed with no blood  Objective: Vital signs in last 24 hours: Temp:  [98 F (36.7 C)-99.1 F (37.3 C)] 98.1 F (36.7 C) (01/16 0602) Pulse Rate:  [58-86] 58  (01/16 0602) Resp:  [18-22] 18  (01/16 0602) BP: (102-135)/(54-77) 135/77 mmHg (01/16 0602) SpO2:  [92 %-94 %] 93 % (01/16 0602) Last BM Date: 01/18/12  Intake/Output from previous day: 01/15 0701 - 01/16 0700 In: 2133 [P.O.:360; I.V.:1773] Out: 350 [Urine:350] Intake/Output this shift:    PE: General- In NAD Abdomen-soft, nontender Anorectal-no bleeding Lab Results:   Basename 01/18/12 0340 01/17/12 0345  WBC 8.2 9.7  HGB 11.4* 11.0*  HCT 34.5* 32.8*  PLT 265 248   BMET  Basename 01/17/12 0345  NA 137  K 4.1  CL 103  CO2 28  GLUCOSE 110*  BUN 13  CREATININE 0.80  CALCIUM 8.5   PT/INR No results found for this basename: LABPROT:2,INR:2 in the last 72 hours Comprehensive Metabolic Panel:    Component Value Date/Time   NA 137 01/17/2012 0345   K 4.1 01/17/2012 0345   CL 103 01/17/2012 0345   CO2 28 01/17/2012 0345   BUN 13 01/17/2012 0345   CREATININE 0.80 01/17/2012 0345   GLUCOSE 110* 01/17/2012 0345   CALCIUM 8.5 01/17/2012 0345   AST 21 01/05/2012 1030   ALT 26 01/05/2012 1030   ALKPHOS 68 01/05/2012 1030   BILITOT 0.2* 01/05/2012 1030   PROT 6.8 01/05/2012 1030   ALBUMIN 3.6 01/05/2012 1030     Studies/Results: No results found.  Anti-infectives: Anti-infectives     Start     Dose/Rate Route Frequency Ordered Stop   01/16/12 0600   cefOXitin (MEFOXIN) 2 g in dextrose 5 % 50 mL IVPB        2 g 100 mL/hr over 30 Minutes Intravenous Every 6 hours 01/16/12 0552            Assessment Principal Problem:  *Rectal adenoma s/p transanal excision 01/09/12 (final pathology showed invasive rectal cancer) with delayed postop bleed after restarting Plavix-scant blood on tissue  paper after one BM last night; none this morning. Active Problems:  CAD S/P percutaneous coronary angioplasty-Plavix held, back on Aspirin;  I discussed this with his cardiologist Dr. Leeann Must and we decided to  hold the Plavix for 2 weeks. ABL anemia-    LOS: 3 days   Plan:  Discharge today.  Instructions given.   Brandon Mora J 01/19/2012

## 2012-01-19 NOTE — Care Management Note (Signed)
    Page 1 of 1   01/19/2012     11:30:50 AM   CARE MANAGEMENT NOTE 01/19/2012  Patient:  Brandon Mora, Brandon Mora   Account Number:  [id number]  Date Initiated:  01/16/2012  Documentation initiated by:  DAVIS,RHONDA  Subjective/Objective Assessment:   patient with hx of recent surg and finding of a invasive rectal ca, brought in by ems due to weakness and rectal bleeding, has been on plavix at home.     Action/Plan:   return to home with wife.   Anticipated DC Date:  01/19/2012   Anticipated DC Plan:  HOME/SELF CARE         Choice offered to / List presented to:             Status of service:  Completed, signed off Medicare Important Message given?  NA - LOS <3 / Initial given by admissions (If response is "NO", the following Medicare IM given date fields will be blank) Date Medicare IM given:   Date Additional Medicare IM given:    Discharge Disposition:  HOME/SELF CARE  Per UR Regulation:  Reviewed for med. necessity/level of care/duration of stay  If discussed at Long Length of Stay Meetings, dates discussed:    Comments:  01132014/Rhonda Earlene Plater, RN, BSN, CCM: CHART REVIEWED AND UPDATED.  Next chart review due on 0162014. NO DISCHARGE NEEDS PRESENT AT THIS TIME. CASE MANAGEMENT [phone]

## 2012-01-20 ENCOUNTER — Other Ambulatory Visit (INDEPENDENT_AMBULATORY_CARE_PROVIDER_SITE_OTHER): Payer: Self-pay | Admitting: General Surgery

## 2012-01-22 ENCOUNTER — Telehealth (INDEPENDENT_AMBULATORY_CARE_PROVIDER_SITE_OTHER): Payer: Self-pay | Admitting: General Surgery

## 2012-01-22 ENCOUNTER — Other Ambulatory Visit (INDEPENDENT_AMBULATORY_CARE_PROVIDER_SITE_OTHER): Payer: Self-pay | Admitting: General Surgery

## 2012-01-22 DIAGNOSIS — K6289 Other specified diseases of anus and rectum: Secondary | ICD-10-CM

## 2012-01-22 MED ORDER — CIPROFLOXACIN HCL 500 MG PO TABS: 500.0000 mg | ORAL_TABLET | Freq: Two times a day (BID) | ORAL | Status: AC

## 2012-01-22 MED ORDER — METRONIDAZOLE 500 MG PO TABS: 500.0000 mg | ORAL_TABLET | Freq: Three times a day (TID) | ORAL | Status: DC

## 2012-01-22 NOTE — Telephone Encounter (Signed)
Patient's wife called to state her husband was having low grade fevers and crampy abd pain prior to bowel movements.  Minimal blood per rectum.  i have called in a 1 week course of cipro and flagyl.  They will touch base with the office mon or tues to let us know how things are going.  They are aware to call back if pain gets worse or fevers get higher.

## 2012-01-23 NOTE — Discharge Summary (Signed)
Physician Discharge Summary  Patient ID: DELFIN Mora MRN: [id number] DOB/AGE: 1945/07/14 67 y.o.  Admit date: 01/16/2012 Discharge date: 01/19/2012  Admission Diagnoses:  Rectal bleeding post transanal excision of rectal tumor  Discharge Diagnoses:   Same Acute blood loss anemia Coronary artery disease Rectal cancer  Discharged Condition: good  Hospital Course: He was brought into the emergency room approximately 2 days after restarting his Plavix for hematochezia. He was taken to the operating room were multiple areas of oozing were noted from his previous transanal excision of his rectal cancer.  This was able to be controlled surgically. Postoperatively, initially he was in the step down unit. His aspirin was held. His diet was slowly advanced and he was transferred to the floor. His aspirin was restarted on his second postoperative day. He did not have any further significant bleeding and was able to be discharged on his third postoperative day. Specific discharge instructions were given to him. I did speak with his cardiologist and we decided to keep him off the Plavix for 2 weeks.  Consults: none  Significant Diagnostic Studies: None  Treatments: surgery: Exam under anesthesia and control of rectal bleeding 01/16/2012  Discharge Exam: Blood pressure 135/77, pulse 58, temperature 98.1 F (36.7 C), temperature source Oral, resp. rate 18, height 5\' 7"  (1.702 m), weight 181 lb 7 oz (82.3 kg), SpO2 93.00%.   Disposition: 01-Home or Self Care  Discharge Orders    Future Appointments: Provider: Department: Dept Phone: Center:   01/27/2012 1:30 PM [facility] Financial Counselor [facility] MEDICAL ONCOLOGY [phone] None   01/27/2012 2:00 PM Ladene Artist, MD [facility] MEDICAL ONCOLOGY [phone] None   01/30/2012 9:00 AM [facility] Nurse [facility] RADIATION ONCOLOGY [phone] None   01/30/2012 9:30 AM Jonna Coup, MD [facility] RADIATION ONCOLOGY [phone] None   01/30/2012 11:30 AM Adolph Pollack, MD [facility] [place] [phone] None       Medication List     As of 01/23/2012  4:24 PM    STOP taking these medications         ciprofloxacin 500 MG tablet   Commonly known as: CIPRO      clopidogrel 75 MG tablet   Commonly known as: PLAVIX      co-enzyme Q-10 30 MG capsule      Fish Oil 1200 MG Caps      metroNIDAZOLE 500 MG tablet   Commonly known as: FLAGYL      SAW PALMETTO PO      Tamsulosin HCl 0.4 MG Caps   Commonly known as: FLOMAX      TAKE these medications         acetaminophen 500 MG tablet   Commonly known as: TYLENOL   Take 500-1,000 mg by mouth every 6 (six) hours as needed. Pain      aspirin 81 MG tablet   Take 81 mg by mouth daily.      cyanocobalamin 1000 MCG tablet   Take 100 mcg by mouth daily.      glucosamine-chondroitin 500-400 MG tablet   Take 1 tablet by mouth daily.      isosorbide mononitrate 30 MG 24 hr tablet   Commonly known as: IMDUR   Take 30 mg by mouth daily.      lisinopril 20 MG tablet   Commonly known as: PRINIVIL,ZESTRIL   Take 20 mg by mouth daily before breakfast.      LIVALO  2 MG Tabs   Generic drug: Pitavastatin Calcium   Take 2 mg by mouth daily before breakfast.      metoprolol succinate 25 MG 24 hr tablet   Commonly known as: TOPROL-XL   Take 25 mg by mouth daily before breakfast.      multivitamin tablet   Take 1 tablet by mouth daily.      NITROSTAT 0.4 MG SL tablet   Generic drug: nitroGLYCERIN   Place 0.4 mg under the tongue as needed.      venlafaxine XR 75 MG 24 hr capsule   Commonly known as: EFFEXOR-XR   Take 75 mg by mouth daily.      ZETIA 10 MG tablet   Generic drug: ezetimibe   Take 10 mg by mouth daily before breakfast.         Signed: Janvi Ammar J 01/23/2012, 4:24 PM

## 2012-01-24 ENCOUNTER — Telehealth (INDEPENDENT_AMBULATORY_CARE_PROVIDER_SITE_OTHER): Payer: Self-pay | Admitting: General Surgery

## 2012-01-24 NOTE — Telephone Encounter (Signed)
Patient called and stated that when he woke up this morning his index finger was Blue, he called and wanted to know should he goes to the hospital where his cardio Doctor and I stated yes, he stated he did not know what to do and I told him that he needed to go now, he needed to be checked out

## 2012-01-25 ENCOUNTER — Telehealth (INDEPENDENT_AMBULATORY_CARE_PROVIDER_SITE_OTHER): Payer: Self-pay

## 2012-01-25 NOTE — Telephone Encounter (Signed)
LMOV checking on pt and asking him to call us if he needs to.

## 2012-01-27 ENCOUNTER — Telehealth: Payer: Self-pay | Admitting: Oncology

## 2012-01-27 ENCOUNTER — Ambulatory Visit (HOSPITAL_BASED_OUTPATIENT_CLINIC_OR_DEPARTMENT_OTHER): Payer: Medicare Other | Admitting: Oncology

## 2012-01-27 ENCOUNTER — Encounter: Payer: Self-pay | Admitting: Oncology

## 2012-01-27 ENCOUNTER — Ambulatory Visit: Payer: Medicare Other

## 2012-01-27 VITALS — BP 123/67 | HR 65 | Temp 97.5°F | Resp 18 | Ht 66.0 in | Wt 174.0 lb

## 2012-01-27 DIAGNOSIS — C787 Secondary malignant neoplasm of liver and intrahepatic bile duct: Secondary | ICD-10-CM

## 2012-01-27 DIAGNOSIS — D490 Neoplasm of unspecified behavior of digestive system: Secondary | ICD-10-CM

## 2012-01-27 DIAGNOSIS — C20 Malignant neoplasm of rectum: Secondary | ICD-10-CM

## 2012-01-27 NOTE — Progress Notes (Signed)
Met with patient and wife.  Explained role of nurse navigator.  Educational information on colorectal cancer provided along with resource names and contact numbers.   Patient declined meeting with a SW or dietician at this time.  Patient would like to be treated with RT closer to home.  Dr. Truett Perna referred patient to [facility] for RT.  Patient still plans to receive chemotherapy under the care of Dr. Truett Perna.  This RN left a voice message with the GI nurse coordinator at [facility], Lyn Sunny [phone]),  to assist in expediting the referral. Will continue to follow as needed.

## 2012-01-27 NOTE — Progress Notes (Signed)
Checked in new pt with no financial concerns. °

## 2012-01-27 NOTE — Progress Notes (Signed)
[facility] New Patient Consult   Referring MD: Merrell Borsuk 67 y.o.  1945-09-15    Reason for Referral: Rectal cancer     HPI: He reports a history of rectal bleeding for several months. He was referred to Dr. Evette Cristal and underwent a colonoscopy on 10/11/2011. A mass was noted in the rectum on digital exam. The mass was non-circumferential. The mass measured 4 cm in length. The exam was otherwise Normal aside from one diverticulum in the sigmoid colon. The biopsy revealed superficial fragments of a tubular villous adenoma with high-grade dysplasia. Dr. Abbey Chatters saw him on 10/19/2011 and noted a mass at 6 cm from the anal verge. Endoscopy demonstrated a tumor at 6 cm from the anal verge. Surgery was delayed secondary to recent placement of a drug eluting stent. He was taken off of Plavix and taken to the operating room on 01/09/2012 4 a transanal excision of the adenomatous rectal polyp.  The pathology ([id number]) revealed invasive colorectal adenocarcinoma associated with a tubulovillous adenoma. The surgical margin was focally involved by adenocarcinoma. Extremities or extension could not be ruled out at the involved margin. In areas with intact muscularis propria superficial muscle involvement was noted favoring a T2 lesion.  He was readmitted on 01/16/2012 with rectal bleeding. An examination under anesthesia was performed with cauterization of bleeding areas in the rectum.  He reports having intermittent fever since the time of surgery. A CT of the abdomen and pelvis on 01/13/2012 revealed no evidence of metastatic disease in the liver. Free intraperitoneal air was present and there was also retroperitoneal air. Thickened rectal mucosa was noted with a few slightly prominent lymph nodes within the adjacent perirectal fat planes worrisome for metastatic involvement. A CT the chest was negative for metastatic disease.  He is referred to consider  neoadjuvant therapy prior to definitive surgery. He complains of feeling "weak "since the admission with rectal bleeding. The rectal bleeding has resolved. He has resumed aspirin.     Past Medical History  Diagnosis Date  . Hyperlipidemia   . Hypertension   . CAD (coronary artery disease)   . Rectal adenoma 10/11/11    tubulovillous adenoma w/dysplasia  . Depression   . Angina at rest   . Vertigo   . Rectal bleeding 10/2011    6 mos hx  . Arthritis   . Heart attack 07/05/11    6 stents placed 07/07/11  . Anxiety   . Melanoma 2004    left cheek  . Rectal cancer 2013    Past Surgical History  Procedure Date  . Finger surgery  1991     r finger amputation following a cat bite   . Cardiac catheterization 2012    x2  . Coronary angioplasty with stent placement 2012, 2013    10/2010, 07/06/11  . Tonsillectomy     as child  . Rectal villous adenoma excision 01/09/2012    Procedure: TRANSANAL EXCISION OF VILLOUS ADENOMA;  Surgeon: Adolph Pollack, MD;  Location: [facility];  Service: General;  Laterality: N/A;  Transanal Excision of Rectal Adenoma  with proctoscopy  . Examination under anesthesia 01/16/2012    Procedure: EXAM UNDER ANESTHESIA;  Surgeon: Adolph Pollack, MD;  Location: [facility];  Service: General;  Laterality: N/A;  Controll of bleeding  . Proctoscopy 01/16/2012    Procedure: PROCTOSCOPY;  Surgeon: Adolph Pollack, MD;  Location: [facility];  Service: General;  Laterality: N/A;    Family History  Problem Relation Age of Onset  . Heart disease Father   . Hypertension Mother    .    Melanoma                                                                                  maternal grandmother  Current outpatient prescriptions:acetaminophen (TYLENOL) 500 MG tablet, Take 500-1,000 mg by mouth every 6 (six) hours as needed. Pain, Disp: , Rfl: ;  aspirin 81 MG tablet, Take 81 mg by mouth daily., Disp: , Rfl: ;  ciprofloxacin (CIPRO) 500 MG tablet, Take 500 mg by mouth Twice  daily., Disp: , Rfl: ;  cyanocobalamin 1000 MCG tablet, Take 100 mcg by mouth daily., Disp: , Rfl:  glucosamine-chondroitin 500-400 MG tablet, Take 1 tablet by mouth daily., Disp: , Rfl: ;  isosorbide mononitrate (IMDUR) 30 MG 24 hr tablet, Take 30 mg by mouth daily., Disp: , Rfl: ;  lisinopril (PRINIVIL,ZESTRIL) 20 MG tablet, Take 20 mg by mouth daily before breakfast., Disp: , Rfl: ;  metoprolol succinate (TOPROL-XL) 25 MG 24 hr tablet, Take 25 mg by mouth daily before breakfast. , Disp: , Rfl:  metroNIDAZOLE (FLAGYL) 500 MG tablet, Take 1 tablet (500 mg total) by mouth 3 (three) times daily., Disp: 21 tablet, Rfl: 0;  Multiple Vitamin (MULTIVITAMIN) tablet, Take 1 tablet by mouth daily., Disp: , Rfl: ;  NITROSTAT 0.4 MG SL tablet, Place 0.4 mg under the tongue as needed. , Disp: , Rfl: ;  Pitavastatin Calcium (LIVALO) 2 MG TABS, Take 2 mg by mouth daily before breakfast. , Disp: , Rfl:  Tamsulosin HCl (FLOMAX) 0.4 MG CAPS, , Disp: , Rfl: ;  venlafaxine XR (EFFEXOR-XR) 75 MG 24 hr capsule, Take 75 mg by mouth daily. , Disp: , Rfl: ;  ZETIA 10 MG tablet, Take 10 mg by mouth daily before breakfast. , Disp: , Rfl:   Allergies:  Allergies  Allergen Reactions  . Lipitor (Atorvastatin)     weakness  . Statins     Muscle weakness  . Sertraline Hcl Rash    Social History: He lives in [place], retired Surveyor, minerals, he does not use tobacco or alcohol. He was in the [employer] from [phone].   ROS:   Positives include: Intermittent fever since the transanal tumor resection, night sweats for "months ", exertional dyspnea, nausea secondary to antibiotics, left shoulder pain, right arm erythema and tenderness (she reports being evaluated in [place] will 01/23/2012 with a negative Doppler)  A complete ROS was otherwise negative.  Physical Exam:  Blood pressure 123/67, pulse 65, temperature 97.5 F (36.4 C), temperature source Oral, resp. rate 18, height 5\' 6"  (1.676 m), weight 174 lb (78.926  kg).  HEENT: Oropharynx without visible mass, neck without mass Lungs: end inspiratory rhonchi at the posterior base bilaterally, no respiratory distress Cardiac: Regular rate and rhythm Abdomen: No hepatosplenomegaly, no mass, nontender GU: Testes without mass  Vascular: No leg edema, there is a palpable cord at the right upper anterior arm without surrounding induration or erythema Lymph nodes: No cervical, supraclavicular, axillary, or inguinal nodes Neurologic: Alert and oriented, the motor exam appears intact in the upper and lower extremities Skin: No  rash Musculoskeletal: No spine tenderness   LAB:  CBC  Lab Results  Component Value Date   WBC 8.2 01/18/2012   HGB 11.4* 01/18/2012   HCT 34.5* 01/18/2012   MCV 89.8 01/18/2012   PLT 265 01/18/2012     CMP      Component Value Date/Time   NA 137 01/17/2012 0345   K 4.1 01/17/2012 0345   CL 103 01/17/2012 0345   CO2 28 01/17/2012 0345   GLUCOSE 110* 01/17/2012 0345   BUN 13 01/17/2012 0345   CREATININE 0.80 01/17/2012 0345   CALCIUM 8.5 01/17/2012 0345   PROT 6.8 01/05/2012 1030   ALBUMIN 3.6 01/05/2012 1030   AST 21 01/05/2012 1030   ALT 26 01/05/2012 1030   ALKPHOS 68 01/05/2012 1030   BILITOT 0.2* 01/05/2012 1030   GFRNONAA 90* 01/17/2012 0345   GFRAA >90 01/17/2012 0345     Radiology: As per history of present illness    Assessment/Plan:   1. Adenocarcinoma of the rectum arising in a tubulovillous adenoma, status post a transanal excision 01/09/2012 with a positive surgical margin  2. Admission with rectal bleeding 01/16/2012, status post an examination under anesthesia and cauterization of bleeding sites in the rectum  3. History of coronary artery disease with coronary stents in place, maintained on aspirin, Plavix currently on hold  4. Postoperative fever-now taking ciprofloxacin and Flagyl  5. History of a "melanoma" of the face, 2004   Disposition:   He has been diagnosed with rectal cancer. I discussed treatment  options with Mr. Croswell and his wife. The T stage of the tumor has not been completely defined, a CT following surgery revealed evidence of enlarged perirectal lymph nodes.  He has been referred to begin neoadjuvant chemotherapy and radiation. I will discuss the case with Dr. Maisie Fus and radiation oncology regarding the need for additional staging with an endoscopic ultrasound or pelvic MRI.  Mr. Barg lives  and hour from the [facility] .It will be more convenient for him to complete the course of radiation in [facility].We will make a referral to radiation oncology at [facility].  I recommend capecitabine to be given on the days of radiation. We reviewed the potential toxicities associated with capecitabine including the chance for mucositis, diarrhea, hematologic toxicity, skin rash, skin hyperpigmentation, and the hand/foot syndrome. He will attend a chemotherapy teaching class.  I anticipate a radiation start date of 02/13/2012. He will contact us if he is scheduled to begin radiation at an earlier date. He will return for an office visit and CBC on 02/23/2012. We will obtain a baseline CEA when he returns for chemotherapy teaching class.  Demetry Bendickson 01/27/2012, 4:21 PM

## 2012-01-27 NOTE — Telephone Encounter (Signed)
Gave pt appt for lab and ML for January and February 2014

## 2012-01-30 ENCOUNTER — Ambulatory Visit: Admission: RE | Admit: 2012-01-30 | Payer: Medicare Other | Source: Ambulatory Visit | Admitting: Radiation Oncology

## 2012-01-30 ENCOUNTER — Ambulatory Visit (INDEPENDENT_AMBULATORY_CARE_PROVIDER_SITE_OTHER): Payer: Medicare Other | Admitting: General Surgery

## 2012-01-30 ENCOUNTER — Encounter (INDEPENDENT_AMBULATORY_CARE_PROVIDER_SITE_OTHER): Payer: Self-pay | Admitting: General Surgery

## 2012-01-30 ENCOUNTER — Ambulatory Visit: Payer: Medicare Other

## 2012-01-30 VITALS — BP 140/86 | HR 72 | Temp 98.5°F | Resp 16 | Ht 66.0 in | Wt 173.4 lb

## 2012-01-30 DIAGNOSIS — C20 Malignant neoplasm of rectum: Secondary | ICD-10-CM

## 2012-01-30 NOTE — Patient Instructions (Signed)
Call if you have severe bleeding problems or high fever.

## 2012-01-30 NOTE — Progress Notes (Signed)
Spoke with patient and he will be seeking treatment at [facility] as this location is closer to him.Informed Clydie Braun to remove from Dr.Moody's schedule.Dr.Moody aware as he informed me appointment today is unnecessary as patient had referral to go to [facility].

## 2012-01-30 NOTE — Progress Notes (Signed)
Procedure:  Transanal excision of rectal tumor which ended up being rectal cancer; return operating room for control of postoperative bleeding  Date: 01/09/12, 01/16/12  Pathology:  Invasive rectal cancer  History:  He comes in for a postoperative visit. He's been seen by medical oncology and radiation oncology. He had a low-grade fever and some lower abdominal discomfort over a week ago and went back on antibiotics. He's been doing fine since then. No significant rectal bleeding. Having multiple loose bowel movements a day. He is due to restart the Plavix today. He did get a blue left index finger which resolved.  Exam: General- Is in NAD. Abdomen-soft, nontender  Anorectal-digital rectal exam demonstrates an irregular area in the left side no blood on the finger.  Assessment:  Rectal cancer status post transanal excision of benign adenomatous polyp that turned out to be cancer. He subsequently had bleeding after restarting Plavix had to be taken back to the operating room for that. No significant bleeding since that time.  Plan:  I feel it would be okay to start his treatments about a month after the surgery. I told to call back if he had any severe bleeding or fever. Post treatments, I told him we'll we will have him see our colorectal specialist Dr. Maisie Fus.

## 2012-01-31 ENCOUNTER — Telehealth: Payer: Self-pay | Admitting: *Deleted

## 2012-01-31 ENCOUNTER — Other Ambulatory Visit: Payer: Medicare Other | Admitting: Lab

## 2012-01-31 ENCOUNTER — Telehealth: Payer: Self-pay | Admitting: Oncology

## 2012-01-31 ENCOUNTER — Other Ambulatory Visit: Payer: Medicare Other

## 2012-01-31 NOTE — Telephone Encounter (Signed)
Spoke with patient and confirmed he had been contacted by [facility] for RT.  He stated he was to see Dr. Serita Sheller on 02/03/12.  He had no questions and appreciated the call.

## 2012-01-31 NOTE — Telephone Encounter (Signed)
Will reschedule chemo class to Thursday at 10am.

## 2012-01-31 NOTE — Telephone Encounter (Signed)
Faxed all [facility] records [facility]

## 2012-02-01 ENCOUNTER — Telehealth: Payer: Self-pay | Admitting: Oncology

## 2012-02-01 NOTE — Telephone Encounter (Signed)
Pt called and r/s appt for labs and chemo class to 02/07/12

## 2012-02-02 ENCOUNTER — Other Ambulatory Visit: Payer: Medicare Other | Admitting: Lab

## 2012-02-02 ENCOUNTER — Other Ambulatory Visit: Payer: Medicare Other

## 2012-02-07 ENCOUNTER — Telehealth: Payer: Self-pay | Admitting: *Deleted

## 2012-02-07 ENCOUNTER — Other Ambulatory Visit: Payer: Self-pay | Admitting: *Deleted

## 2012-02-07 ENCOUNTER — Telehealth: Payer: Self-pay | Admitting: Oncology

## 2012-02-07 ENCOUNTER — Other Ambulatory Visit (HOSPITAL_BASED_OUTPATIENT_CLINIC_OR_DEPARTMENT_OTHER): Payer: Medicare Other

## 2012-02-07 ENCOUNTER — Other Ambulatory Visit: Payer: Medicare Other

## 2012-02-07 DIAGNOSIS — C20 Malignant neoplasm of rectum: Secondary | ICD-10-CM

## 2012-02-07 DIAGNOSIS — D490 Neoplasm of unspecified behavior of digestive system: Secondary | ICD-10-CM

## 2012-02-07 LAB — CBC WITH DIFFERENTIAL/PLATELET
Basophils Absolute: 0.1 10*3/uL (ref 0.0–0.1)
EOS%: 2.2 % (ref 0.0–7.0)
Eosinophils Absolute: 0.1 10*3/uL (ref 0.0–0.5)
HCT: 40.5 % (ref 38.4–49.9)
HGB: 13.6 g/dL (ref 13.0–17.1)
LYMPH%: 28.8 % (ref 14.0–49.0)
MCH: 30.1 pg (ref 27.2–33.4)
MCV: 89.6 fL (ref 79.3–98.0)
MONO%: 11.7 % (ref 0.0–14.0)
NEUT#: 3.3 10*3/uL (ref 1.5–6.5)
NEUT%: 56.4 % (ref 39.0–75.0)
Platelets: 212 10*3/uL (ref 140–400)
RDW: 14.3 % (ref 11.0–14.6)

## 2012-02-07 LAB — COMPREHENSIVE METABOLIC PANEL (CC13)
Alkaline Phosphatase: 69 U/L (ref 40–150)
BUN: 22.4 mg/dL (ref 7.0–26.0)
CO2: 27 mEq/L (ref 22–29)
Glucose: 103 mg/dl — ABNORMAL HIGH (ref 70–99)
Total Bilirubin: 0.24 mg/dL (ref 0.20–1.20)
Total Protein: 7.1 g/dL (ref 6.4–8.3)

## 2012-02-07 NOTE — Telephone Encounter (Signed)
Called pt talked to wife she does not want to r/s chemo class at the moment

## 2012-02-07 NOTE — Telephone Encounter (Signed)
Spoke with wife

## 2012-02-07 NOTE — Telephone Encounter (Signed)
Patient's wife phoned to cancel patient's chemo class for today.  She stated she was sick  And that  he was stating to get sick with a cold (no fever).  She stated he was still coming for labs, but that they were going to re-schedule chemo class to a different day when they both could attend class.  She also stated they had seen Dr. [place] Blas at [facility] and that his tentative start date for RT was sometime around the 13th of Feb.  She stated they still need Xeloda and had not heard anything from any pharmacies or this office.  Dr. Kalman Drape nurse was given this information re: Xeloda.  Alleta was notified of cancellation of chemo class and the need to be re-scheduled.  POF sent to scheduler.

## 2012-02-09 ENCOUNTER — Encounter: Payer: Self-pay | Admitting: Oncology

## 2012-02-09 NOTE — Progress Notes (Signed)
Faxed xeloda prescription to [facility] °

## 2012-02-13 NOTE — Progress Notes (Signed)
RECEIVED A FAX FROM [facility] CONCERNING A CONFIRMATION OF PRESCRIPTION SHIPMENT FOR XELODA ON 02/10/12 

## 2012-02-23 ENCOUNTER — Telehealth: Payer: Self-pay | Admitting: *Deleted

## 2012-02-23 ENCOUNTER — Ambulatory Visit: Payer: Medicare Other | Admitting: Nurse Practitioner

## 2012-02-23 NOTE — Telephone Encounter (Signed)
Phone call to patient's wife since patient did not come in for appointment today.  Per patient's wife, "I called and cancelled all of the appointments at your office because we are seeing a medical oncologist at [facility] now".  She stated, "my husband started radiation and Xeloda yesterday, 02/22/12.  She stated they had seen a new medical oncologist at [facility], Dr. Lavella Lemons."   This RN thanked patient's wife  For the information and wished them well.   Dr. Truett Perna informed of above.

## 2013-09-13 ENCOUNTER — Encounter: Payer: Self-pay | Admitting: *Deleted

## 2014-01-15 DIAGNOSIS — J019 Acute sinusitis, unspecified: Secondary | ICD-10-CM | POA: Diagnosis not present

## 2014-01-15 NOTE — Telephone Encounter (Signed)
erroneous

## 2014-02-04 DIAGNOSIS — J309 Allergic rhinitis, unspecified: Secondary | ICD-10-CM | POA: Diagnosis not present

## 2014-02-04 DIAGNOSIS — Z86711 Personal history of pulmonary embolism: Secondary | ICD-10-CM | POA: Diagnosis not present

## 2014-02-04 DIAGNOSIS — I10 Essential (primary) hypertension: Secondary | ICD-10-CM | POA: Diagnosis not present

## 2014-02-04 DIAGNOSIS — E119 Type 2 diabetes mellitus without complications: Secondary | ICD-10-CM | POA: Diagnosis not present

## 2014-02-04 DIAGNOSIS — I25119 Atherosclerotic heart disease of native coronary artery with unspecified angina pectoris: Secondary | ICD-10-CM | POA: Diagnosis not present

## 2014-02-04 DIAGNOSIS — E780 Pure hypercholesterolemia: Secondary | ICD-10-CM | POA: Diagnosis not present

## 2014-02-25 DIAGNOSIS — E780 Pure hypercholesterolemia: Secondary | ICD-10-CM | POA: Diagnosis not present

## 2014-02-25 DIAGNOSIS — I25119 Atherosclerotic heart disease of native coronary artery with unspecified angina pectoris: Secondary | ICD-10-CM | POA: Diagnosis not present

## 2014-02-25 DIAGNOSIS — E119 Type 2 diabetes mellitus without complications: Secondary | ICD-10-CM | POA: Diagnosis not present

## 2014-05-10 DIAGNOSIS — C20 Malignant neoplasm of rectum: Secondary | ICD-10-CM | POA: Diagnosis not present

## 2014-05-10 DIAGNOSIS — R072 Precordial pain: Secondary | ICD-10-CM | POA: Diagnosis not present

## 2014-05-10 DIAGNOSIS — R918 Other nonspecific abnormal finding of lung field: Secondary | ICD-10-CM | POA: Diagnosis not present

## 2014-05-10 DIAGNOSIS — N131 Hydronephrosis with ureteral stricture, not elsewhere classified: Secondary | ICD-10-CM | POA: Diagnosis not present

## 2014-05-10 DIAGNOSIS — K432 Incisional hernia without obstruction or gangrene: Secondary | ICD-10-CM | POA: Diagnosis not present

## 2014-05-10 DIAGNOSIS — R001 Bradycardia, unspecified: Secondary | ICD-10-CM | POA: Diagnosis not present

## 2014-05-10 DIAGNOSIS — Z932 Ileostomy status: Secondary | ICD-10-CM | POA: Diagnosis not present

## 2014-05-10 DIAGNOSIS — K439 Ventral hernia without obstruction or gangrene: Secondary | ICD-10-CM | POA: Diagnosis not present

## 2014-05-10 DIAGNOSIS — Z86711 Personal history of pulmonary embolism: Secondary | ICD-10-CM | POA: Diagnosis not present

## 2014-05-10 DIAGNOSIS — M549 Dorsalgia, unspecified: Secondary | ICD-10-CM | POA: Diagnosis not present

## 2014-05-10 DIAGNOSIS — E119 Type 2 diabetes mellitus without complications: Secondary | ICD-10-CM | POA: Diagnosis not present

## 2014-05-10 DIAGNOSIS — I2510 Atherosclerotic heart disease of native coronary artery without angina pectoris: Secondary | ICD-10-CM | POA: Diagnosis not present

## 2014-05-10 DIAGNOSIS — R079 Chest pain, unspecified: Secondary | ICD-10-CM | POA: Diagnosis not present

## 2014-05-11 DIAGNOSIS — Z955 Presence of coronary angioplasty implant and graft: Secondary | ICD-10-CM | POA: Diagnosis not present

## 2014-05-11 DIAGNOSIS — Z86718 Personal history of other venous thrombosis and embolism: Secondary | ICD-10-CM | POA: Diagnosis not present

## 2014-05-11 DIAGNOSIS — M549 Dorsalgia, unspecified: Secondary | ICD-10-CM | POA: Diagnosis not present

## 2014-05-11 DIAGNOSIS — I2510 Atherosclerotic heart disease of native coronary artery without angina pectoris: Secondary | ICD-10-CM | POA: Diagnosis not present

## 2014-05-11 DIAGNOSIS — Z86711 Personal history of pulmonary embolism: Secondary | ICD-10-CM | POA: Diagnosis not present

## 2014-05-11 DIAGNOSIS — I10 Essential (primary) hypertension: Secondary | ICD-10-CM | POA: Diagnosis not present

## 2014-05-11 DIAGNOSIS — C20 Malignant neoplasm of rectum: Secondary | ICD-10-CM | POA: Diagnosis not present

## 2014-05-11 DIAGNOSIS — K439 Ventral hernia without obstruction or gangrene: Secondary | ICD-10-CM | POA: Diagnosis not present

## 2014-05-11 DIAGNOSIS — Z932 Ileostomy status: Secondary | ICD-10-CM | POA: Diagnosis not present

## 2014-05-11 DIAGNOSIS — I5022 Chronic systolic (congestive) heart failure: Secondary | ICD-10-CM | POA: Diagnosis not present

## 2014-05-11 DIAGNOSIS — E119 Type 2 diabetes mellitus without complications: Secondary | ICD-10-CM | POA: Diagnosis not present

## 2014-05-11 DIAGNOSIS — N131 Hydronephrosis with ureteral stricture, not elsewhere classified: Secondary | ICD-10-CM | POA: Diagnosis not present

## 2014-05-11 DIAGNOSIS — R079 Chest pain, unspecified: Secondary | ICD-10-CM | POA: Diagnosis not present

## 2014-05-11 DIAGNOSIS — R918 Other nonspecific abnormal finding of lung field: Secondary | ICD-10-CM | POA: Diagnosis not present

## 2014-05-11 DIAGNOSIS — K432 Incisional hernia without obstruction or gangrene: Secondary | ICD-10-CM | POA: Diagnosis not present

## 2014-05-12 DIAGNOSIS — N131 Hydronephrosis with ureteral stricture, not elsewhere classified: Secondary | ICD-10-CM | POA: Diagnosis not present

## 2014-05-12 DIAGNOSIS — K432 Incisional hernia without obstruction or gangrene: Secondary | ICD-10-CM | POA: Diagnosis not present

## 2014-05-12 DIAGNOSIS — R918 Other nonspecific abnormal finding of lung field: Secondary | ICD-10-CM | POA: Diagnosis not present

## 2014-05-12 DIAGNOSIS — E119 Type 2 diabetes mellitus without complications: Secondary | ICD-10-CM | POA: Diagnosis not present

## 2014-05-12 DIAGNOSIS — C20 Malignant neoplasm of rectum: Secondary | ICD-10-CM | POA: Diagnosis not present

## 2014-05-12 DIAGNOSIS — K439 Ventral hernia without obstruction or gangrene: Secondary | ICD-10-CM | POA: Diagnosis not present

## 2014-05-12 DIAGNOSIS — M549 Dorsalgia, unspecified: Secondary | ICD-10-CM | POA: Diagnosis not present

## 2014-05-12 DIAGNOSIS — I2510 Atherosclerotic heart disease of native coronary artery without angina pectoris: Secondary | ICD-10-CM | POA: Diagnosis not present

## 2014-05-12 DIAGNOSIS — R079 Chest pain, unspecified: Secondary | ICD-10-CM | POA: Diagnosis not present

## 2014-05-12 DIAGNOSIS — Z932 Ileostomy status: Secondary | ICD-10-CM | POA: Diagnosis not present

## 2014-05-13 DIAGNOSIS — N131 Hydronephrosis with ureteral stricture, not elsewhere classified: Secondary | ICD-10-CM | POA: Diagnosis not present

## 2014-05-13 DIAGNOSIS — Z932 Ileostomy status: Secondary | ICD-10-CM | POA: Diagnosis not present

## 2014-05-13 DIAGNOSIS — R079 Chest pain, unspecified: Secondary | ICD-10-CM | POA: Diagnosis not present

## 2014-05-13 DIAGNOSIS — R935 Abnormal findings on diagnostic imaging of other abdominal regions, including retroperitoneum: Secondary | ICD-10-CM | POA: Diagnosis not present

## 2014-05-13 DIAGNOSIS — I2510 Atherosclerotic heart disease of native coronary artery without angina pectoris: Secondary | ICD-10-CM | POA: Diagnosis not present

## 2014-05-13 DIAGNOSIS — Z0389 Encounter for observation for other suspected diseases and conditions ruled out: Secondary | ICD-10-CM | POA: Diagnosis not present

## 2014-05-13 DIAGNOSIS — R918 Other nonspecific abnormal finding of lung field: Secondary | ICD-10-CM | POA: Diagnosis not present

## 2014-05-13 DIAGNOSIS — M549 Dorsalgia, unspecified: Secondary | ICD-10-CM | POA: Diagnosis not present

## 2014-05-13 DIAGNOSIS — C20 Malignant neoplasm of rectum: Secondary | ICD-10-CM | POA: Diagnosis not present

## 2014-05-13 DIAGNOSIS — K432 Incisional hernia without obstruction or gangrene: Secondary | ICD-10-CM | POA: Diagnosis not present

## 2014-05-13 DIAGNOSIS — K439 Ventral hernia without obstruction or gangrene: Secondary | ICD-10-CM | POA: Diagnosis not present

## 2014-05-22 DIAGNOSIS — I252 Old myocardial infarction: Secondary | ICD-10-CM | POA: Diagnosis not present

## 2014-05-22 DIAGNOSIS — C20 Malignant neoplasm of rectum: Secondary | ICD-10-CM | POA: Diagnosis not present

## 2014-05-22 DIAGNOSIS — E119 Type 2 diabetes mellitus without complications: Secondary | ICD-10-CM | POA: Diagnosis not present

## 2014-05-22 DIAGNOSIS — E785 Hyperlipidemia, unspecified: Secondary | ICD-10-CM | POA: Diagnosis not present

## 2014-05-22 DIAGNOSIS — Z955 Presence of coronary angioplasty implant and graft: Secondary | ICD-10-CM | POA: Diagnosis not present

## 2014-05-22 DIAGNOSIS — I2510 Atherosclerotic heart disease of native coronary artery without angina pectoris: Secondary | ICD-10-CM | POA: Diagnosis not present

## 2014-05-22 DIAGNOSIS — I10 Essential (primary) hypertension: Secondary | ICD-10-CM | POA: Diagnosis not present

## 2014-05-22 DIAGNOSIS — I2782 Chronic pulmonary embolism: Secondary | ICD-10-CM | POA: Diagnosis not present

## 2014-07-14 DIAGNOSIS — K5669 Other intestinal obstruction: Secondary | ICD-10-CM | POA: Diagnosis not present

## 2014-07-14 DIAGNOSIS — K639 Disease of intestine, unspecified: Secondary | ICD-10-CM | POA: Diagnosis not present

## 2014-07-14 DIAGNOSIS — I493 Ventricular premature depolarization: Secondary | ICD-10-CM | POA: Diagnosis not present

## 2014-07-14 DIAGNOSIS — I252 Old myocardial infarction: Secondary | ICD-10-CM | POA: Diagnosis not present

## 2014-07-14 DIAGNOSIS — K439 Ventral hernia without obstruction or gangrene: Secondary | ICD-10-CM | POA: Diagnosis not present

## 2014-07-14 DIAGNOSIS — Z86711 Personal history of pulmonary embolism: Secondary | ICD-10-CM | POA: Diagnosis not present

## 2014-07-14 DIAGNOSIS — E119 Type 2 diabetes mellitus without complications: Secondary | ICD-10-CM | POA: Diagnosis not present

## 2014-07-14 DIAGNOSIS — C20 Malignant neoplasm of rectum: Secondary | ICD-10-CM | POA: Diagnosis not present

## 2014-07-14 DIAGNOSIS — R0789 Other chest pain: Secondary | ICD-10-CM | POA: Diagnosis not present

## 2014-07-14 DIAGNOSIS — Z9221 Personal history of antineoplastic chemotherapy: Secondary | ICD-10-CM | POA: Diagnosis not present

## 2014-07-14 DIAGNOSIS — R109 Unspecified abdominal pain: Secondary | ICD-10-CM | POA: Diagnosis not present

## 2014-07-14 DIAGNOSIS — Z85048 Personal history of other malignant neoplasm of rectum, rectosigmoid junction, and anus: Secondary | ICD-10-CM | POA: Diagnosis not present

## 2014-07-14 DIAGNOSIS — R079 Chest pain, unspecified: Secondary | ICD-10-CM | POA: Diagnosis not present

## 2014-07-14 DIAGNOSIS — I10 Essential (primary) hypertension: Secondary | ICD-10-CM | POA: Diagnosis not present

## 2014-07-14 DIAGNOSIS — Z86718 Personal history of other venous thrombosis and embolism: Secondary | ICD-10-CM | POA: Diagnosis not present

## 2014-07-14 DIAGNOSIS — K5660 Unspecified intestinal obstruction: Secondary | ICD-10-CM | POA: Diagnosis not present

## 2014-07-14 DIAGNOSIS — I2510 Atherosclerotic heart disease of native coronary artery without angina pectoris: Secondary | ICD-10-CM | POA: Diagnosis not present

## 2014-08-15 DIAGNOSIS — K5669 Other intestinal obstruction: Secondary | ICD-10-CM | POA: Diagnosis not present

## 2014-09-09 DIAGNOSIS — E119 Type 2 diabetes mellitus without complications: Secondary | ICD-10-CM | POA: Diagnosis not present

## 2014-09-09 DIAGNOSIS — E780 Pure hypercholesterolemia: Secondary | ICD-10-CM | POA: Diagnosis not present

## 2014-09-09 DIAGNOSIS — I10 Essential (primary) hypertension: Secondary | ICD-10-CM | POA: Diagnosis not present

## 2014-09-10 DIAGNOSIS — R5383 Other fatigue: Secondary | ICD-10-CM | POA: Diagnosis not present

## 2014-09-10 DIAGNOSIS — Z23 Encounter for immunization: Secondary | ICD-10-CM | POA: Diagnosis not present

## 2014-09-10 DIAGNOSIS — R400 Somnolence: Secondary | ICD-10-CM | POA: Diagnosis not present

## 2014-09-10 DIAGNOSIS — E119 Type 2 diabetes mellitus without complications: Secondary | ICD-10-CM | POA: Diagnosis not present

## 2014-09-10 DIAGNOSIS — E780 Pure hypercholesterolemia: Secondary | ICD-10-CM | POA: Diagnosis not present

## 2014-09-10 DIAGNOSIS — I10 Essential (primary) hypertension: Secondary | ICD-10-CM | POA: Diagnosis not present

## 2014-09-10 DIAGNOSIS — Z0001 Encounter for general adult medical examination with abnormal findings: Secondary | ICD-10-CM | POA: Diagnosis not present

## 2014-09-15 DIAGNOSIS — I10 Essential (primary) hypertension: Secondary | ICD-10-CM | POA: Diagnosis not present

## 2014-09-15 DIAGNOSIS — Z85048 Personal history of other malignant neoplasm of rectum, rectosigmoid junction, and anus: Secondary | ICD-10-CM | POA: Diagnosis not present

## 2014-09-15 DIAGNOSIS — I2510 Atherosclerotic heart disease of native coronary artery without angina pectoris: Secondary | ICD-10-CM | POA: Diagnosis not present

## 2014-09-15 DIAGNOSIS — Z1211 Encounter for screening for malignant neoplasm of colon: Secondary | ICD-10-CM | POA: Diagnosis not present

## 2014-09-15 DIAGNOSIS — K219 Gastro-esophageal reflux disease without esophagitis: Secondary | ICD-10-CM | POA: Diagnosis not present

## 2014-09-15 DIAGNOSIS — K5660 Unspecified intestinal obstruction: Secondary | ICD-10-CM | POA: Diagnosis not present

## 2014-09-15 DIAGNOSIS — K5730 Diverticulosis of large intestine without perforation or abscess without bleeding: Secondary | ICD-10-CM | POA: Diagnosis not present

## 2014-09-15 DIAGNOSIS — E780 Pure hypercholesterolemia: Secondary | ICD-10-CM | POA: Diagnosis not present

## 2014-09-15 DIAGNOSIS — G4700 Insomnia, unspecified: Secondary | ICD-10-CM | POA: Diagnosis not present

## 2014-09-15 DIAGNOSIS — F329 Major depressive disorder, single episode, unspecified: Secondary | ICD-10-CM | POA: Diagnosis not present

## 2014-09-16 DIAGNOSIS — I499 Cardiac arrhythmia, unspecified: Secondary | ICD-10-CM | POA: Diagnosis not present

## 2014-09-16 DIAGNOSIS — R001 Bradycardia, unspecified: Secondary | ICD-10-CM | POA: Diagnosis not present

## 2014-09-16 DIAGNOSIS — R9431 Abnormal electrocardiogram [ECG] [EKG]: Secondary | ICD-10-CM | POA: Diagnosis not present

## 2014-11-06 DIAGNOSIS — K432 Incisional hernia without obstruction or gangrene: Secondary | ICD-10-CM | POA: Diagnosis not present

## 2015-02-20 DIAGNOSIS — K432 Incisional hernia without obstruction or gangrene: Secondary | ICD-10-CM | POA: Diagnosis not present

## 2015-02-20 DIAGNOSIS — C20 Malignant neoplasm of rectum: Secondary | ICD-10-CM | POA: Diagnosis not present

## 2015-03-11 DIAGNOSIS — F329 Major depressive disorder, single episode, unspecified: Secondary | ICD-10-CM | POA: Diagnosis not present

## 2015-03-11 DIAGNOSIS — Z86711 Personal history of pulmonary embolism: Secondary | ICD-10-CM | POA: Diagnosis not present

## 2015-03-11 DIAGNOSIS — I25119 Atherosclerotic heart disease of native coronary artery with unspecified angina pectoris: Secondary | ICD-10-CM | POA: Diagnosis not present

## 2015-03-11 DIAGNOSIS — I10 Essential (primary) hypertension: Secondary | ICD-10-CM | POA: Diagnosis not present

## 2015-03-11 DIAGNOSIS — E7800 Pure hypercholesterolemia, unspecified: Secondary | ICD-10-CM | POA: Diagnosis not present

## 2015-03-11 DIAGNOSIS — I700 Atherosclerosis of aorta: Secondary | ICD-10-CM | POA: Diagnosis not present

## 2015-03-11 DIAGNOSIS — E119 Type 2 diabetes mellitus without complications: Secondary | ICD-10-CM | POA: Diagnosis not present

## 2015-03-12 DIAGNOSIS — E119 Type 2 diabetes mellitus without complications: Secondary | ICD-10-CM | POA: Diagnosis not present

## 2015-03-12 DIAGNOSIS — I25119 Atherosclerotic heart disease of native coronary artery with unspecified angina pectoris: Secondary | ICD-10-CM | POA: Diagnosis not present

## 2015-03-12 DIAGNOSIS — F329 Major depressive disorder, single episode, unspecified: Secondary | ICD-10-CM | POA: Diagnosis not present

## 2015-03-12 DIAGNOSIS — I10 Essential (primary) hypertension: Secondary | ICD-10-CM | POA: Diagnosis not present

## 2015-03-12 DIAGNOSIS — Z86711 Personal history of pulmonary embolism: Secondary | ICD-10-CM | POA: Diagnosis not present

## 2015-03-12 DIAGNOSIS — I700 Atherosclerosis of aorta: Secondary | ICD-10-CM | POA: Diagnosis not present

## 2015-03-12 DIAGNOSIS — E7800 Pure hypercholesterolemia, unspecified: Secondary | ICD-10-CM | POA: Diagnosis not present

## 2015-09-16 DIAGNOSIS — R2689 Other abnormalities of gait and mobility: Secondary | ICD-10-CM | POA: Diagnosis not present

## 2015-09-16 DIAGNOSIS — R079 Chest pain, unspecified: Secondary | ICD-10-CM | POA: Diagnosis not present

## 2015-09-16 DIAGNOSIS — I499 Cardiac arrhythmia, unspecified: Secondary | ICD-10-CM | POA: Diagnosis not present

## 2015-09-16 DIAGNOSIS — H538 Other visual disturbances: Secondary | ICD-10-CM | POA: Diagnosis not present

## 2015-09-16 DIAGNOSIS — R2990 Unspecified symptoms and signs involving the nervous system: Secondary | ICD-10-CM | POA: Diagnosis not present

## 2015-09-16 DIAGNOSIS — I6501 Occlusion and stenosis of right vertebral artery: Secondary | ICD-10-CM | POA: Diagnosis not present

## 2015-09-16 DIAGNOSIS — I672 Cerebral atherosclerosis: Secondary | ICD-10-CM | POA: Diagnosis not present

## 2015-09-16 DIAGNOSIS — R4781 Slurred speech: Secondary | ICD-10-CM | POA: Diagnosis not present

## 2015-09-16 DIAGNOSIS — I2510 Atherosclerotic heart disease of native coronary artery without angina pectoris: Secondary | ICD-10-CM | POA: Diagnosis not present

## 2015-09-16 DIAGNOSIS — R278 Other lack of coordination: Secondary | ICD-10-CM | POA: Diagnosis not present

## 2015-09-16 DIAGNOSIS — I491 Atrial premature depolarization: Secondary | ICD-10-CM | POA: Diagnosis not present

## 2015-09-16 DIAGNOSIS — R55 Syncope and collapse: Secondary | ICD-10-CM | POA: Diagnosis not present

## 2015-09-16 DIAGNOSIS — I252 Old myocardial infarction: Secondary | ICD-10-CM | POA: Diagnosis not present

## 2015-09-16 DIAGNOSIS — R51 Headache: Secondary | ICD-10-CM | POA: Diagnosis not present

## 2015-09-17 DIAGNOSIS — I252 Old myocardial infarction: Secondary | ICD-10-CM | POA: Diagnosis not present

## 2015-09-17 DIAGNOSIS — E785 Hyperlipidemia, unspecified: Secondary | ICD-10-CM | POA: Diagnosis not present

## 2015-09-17 DIAGNOSIS — H53133 Sudden visual loss, bilateral: Secondary | ICD-10-CM | POA: Diagnosis not present

## 2015-09-17 DIAGNOSIS — R278 Other lack of coordination: Secondary | ICD-10-CM | POA: Diagnosis not present

## 2015-09-17 DIAGNOSIS — I499 Cardiac arrhythmia, unspecified: Secondary | ICD-10-CM | POA: Diagnosis not present

## 2015-09-17 DIAGNOSIS — I2510 Atherosclerotic heart disease of native coronary artery without angina pectoris: Secondary | ICD-10-CM | POA: Diagnosis not present

## 2015-09-17 DIAGNOSIS — Z85048 Personal history of other malignant neoplasm of rectum, rectosigmoid junction, and anus: Secondary | ICD-10-CM | POA: Diagnosis not present

## 2015-09-17 DIAGNOSIS — R55 Syncope and collapse: Secondary | ICD-10-CM | POA: Diagnosis not present

## 2015-09-17 DIAGNOSIS — R4781 Slurred speech: Secondary | ICD-10-CM | POA: Diagnosis not present

## 2015-09-17 DIAGNOSIS — R079 Chest pain, unspecified: Secondary | ICD-10-CM | POA: Diagnosis not present

## 2015-09-17 DIAGNOSIS — R001 Bradycardia, unspecified: Secondary | ICD-10-CM | POA: Diagnosis not present

## 2015-09-17 DIAGNOSIS — I10 Essential (primary) hypertension: Secondary | ICD-10-CM | POA: Diagnosis not present

## 2015-09-17 DIAGNOSIS — I491 Atrial premature depolarization: Secondary | ICD-10-CM | POA: Diagnosis not present

## 2015-09-17 DIAGNOSIS — R51 Headache: Secondary | ICD-10-CM | POA: Diagnosis not present

## 2015-09-17 DIAGNOSIS — R2990 Unspecified symptoms and signs involving the nervous system: Secondary | ICD-10-CM | POA: Diagnosis not present

## 2015-09-17 DIAGNOSIS — R251 Tremor, unspecified: Secondary | ICD-10-CM | POA: Diagnosis not present

## 2015-09-17 DIAGNOSIS — R42 Dizziness and giddiness: Secondary | ICD-10-CM | POA: Diagnosis not present

## 2015-09-17 DIAGNOSIS — I517 Cardiomegaly: Secondary | ICD-10-CM | POA: Diagnosis not present

## 2015-09-17 DIAGNOSIS — H538 Other visual disturbances: Secondary | ICD-10-CM | POA: Diagnosis not present

## 2015-09-30 DIAGNOSIS — G459 Transient cerebral ischemic attack, unspecified: Secondary | ICD-10-CM | POA: Diagnosis not present

## 2015-09-30 DIAGNOSIS — Z86711 Personal history of pulmonary embolism: Secondary | ICD-10-CM | POA: Diagnosis not present

## 2015-09-30 DIAGNOSIS — Z7982 Long term (current) use of aspirin: Secondary | ICD-10-CM | POA: Diagnosis not present

## 2015-09-30 DIAGNOSIS — E7800 Pure hypercholesterolemia, unspecified: Secondary | ICD-10-CM | POA: Diagnosis not present

## 2015-09-30 DIAGNOSIS — I252 Old myocardial infarction: Secondary | ICD-10-CM | POA: Diagnosis not present

## 2015-09-30 DIAGNOSIS — I2510 Atherosclerotic heart disease of native coronary artery without angina pectoris: Secondary | ICD-10-CM | POA: Diagnosis not present

## 2015-09-30 DIAGNOSIS — E785 Hyperlipidemia, unspecified: Secondary | ICD-10-CM | POA: Diagnosis not present

## 2015-09-30 DIAGNOSIS — Z955 Presence of coronary angioplasty implant and graft: Secondary | ICD-10-CM | POA: Diagnosis not present

## 2015-09-30 DIAGNOSIS — I10 Essential (primary) hypertension: Secondary | ICD-10-CM | POA: Diagnosis not present

## 2015-10-15 DIAGNOSIS — I499 Cardiac arrhythmia, unspecified: Secondary | ICD-10-CM | POA: Diagnosis not present

## 2015-10-19 DIAGNOSIS — R7303 Prediabetes: Secondary | ICD-10-CM | POA: Diagnosis not present

## 2015-10-19 DIAGNOSIS — I5022 Chronic systolic (congestive) heart failure: Secondary | ICD-10-CM | POA: Diagnosis not present

## 2015-10-19 DIAGNOSIS — I2510 Atherosclerotic heart disease of native coronary artery without angina pectoris: Secondary | ICD-10-CM | POA: Diagnosis not present

## 2015-10-19 DIAGNOSIS — Z7689 Persons encountering health services in other specified circumstances: Secondary | ICD-10-CM | POA: Diagnosis not present

## 2015-10-19 DIAGNOSIS — I252 Old myocardial infarction: Secondary | ICD-10-CM | POA: Diagnosis not present

## 2015-10-19 DIAGNOSIS — I2782 Chronic pulmonary embolism: Secondary | ICD-10-CM | POA: Diagnosis not present

## 2015-10-19 DIAGNOSIS — H9193 Unspecified hearing loss, bilateral: Secondary | ICD-10-CM | POA: Diagnosis not present

## 2015-10-19 DIAGNOSIS — C20 Malignant neoplasm of rectum: Secondary | ICD-10-CM | POA: Diagnosis not present

## 2015-10-19 DIAGNOSIS — I110 Hypertensive heart disease with heart failure: Secondary | ICD-10-CM | POA: Diagnosis not present

## 2015-10-19 DIAGNOSIS — H547 Unspecified visual loss: Secondary | ICD-10-CM | POA: Diagnosis not present

## 2015-10-19 DIAGNOSIS — E7800 Pure hypercholesterolemia, unspecified: Secondary | ICD-10-CM | POA: Diagnosis not present

## 2015-10-26 DIAGNOSIS — H903 Sensorineural hearing loss, bilateral: Secondary | ICD-10-CM | POA: Diagnosis not present

## 2015-10-26 DIAGNOSIS — Z570 Occupational exposure to noise: Secondary | ICD-10-CM | POA: Diagnosis not present

## 2015-10-26 DIAGNOSIS — H9313 Tinnitus, bilateral: Secondary | ICD-10-CM | POA: Diagnosis not present

## 2015-11-18 DIAGNOSIS — I2510 Atherosclerotic heart disease of native coronary artery without angina pectoris: Secondary | ICD-10-CM | POA: Diagnosis not present

## 2015-11-18 DIAGNOSIS — Z7982 Long term (current) use of aspirin: Secondary | ICD-10-CM | POA: Diagnosis not present

## 2015-11-18 DIAGNOSIS — I5022 Chronic systolic (congestive) heart failure: Secondary | ICD-10-CM | POA: Diagnosis not present

## 2015-11-18 DIAGNOSIS — E784 Other hyperlipidemia: Secondary | ICD-10-CM | POA: Diagnosis not present

## 2015-11-18 DIAGNOSIS — Z79899 Other long term (current) drug therapy: Secondary | ICD-10-CM | POA: Diagnosis not present

## 2015-11-18 DIAGNOSIS — Z23 Encounter for immunization: Secondary | ICD-10-CM | POA: Diagnosis not present

## 2015-11-30 DIAGNOSIS — Z461 Encounter for fitting and adjustment of hearing aid: Secondary | ICD-10-CM | POA: Diagnosis not present

## 2016-01-11 DIAGNOSIS — G459 Transient cerebral ischemic attack, unspecified: Secondary | ICD-10-CM | POA: Diagnosis not present

## 2016-01-11 DIAGNOSIS — I472 Ventricular tachycardia: Secondary | ICD-10-CM | POA: Diagnosis not present

## 2016-01-11 DIAGNOSIS — Z7902 Long term (current) use of antithrombotics/antiplatelets: Secondary | ICD-10-CM | POA: Diagnosis not present

## 2016-01-11 DIAGNOSIS — Z7982 Long term (current) use of aspirin: Secondary | ICD-10-CM | POA: Diagnosis not present

## 2016-01-11 DIAGNOSIS — I10 Essential (primary) hypertension: Secondary | ICD-10-CM | POA: Diagnosis not present

## 2016-01-11 DIAGNOSIS — Z955 Presence of coronary angioplasty implant and graft: Secondary | ICD-10-CM | POA: Diagnosis not present

## 2016-01-11 DIAGNOSIS — Z8673 Personal history of transient ischemic attack (TIA), and cerebral infarction without residual deficits: Secondary | ICD-10-CM | POA: Diagnosis not present

## 2016-01-14 DIAGNOSIS — H01025 Squamous blepharitis left lower eyelid: Secondary | ICD-10-CM | POA: Diagnosis not present

## 2016-01-14 DIAGNOSIS — H01022 Squamous blepharitis right lower eyelid: Secondary | ICD-10-CM | POA: Diagnosis not present

## 2016-01-14 DIAGNOSIS — H11823 Conjunctivochalasis, bilateral: Secondary | ICD-10-CM | POA: Diagnosis not present

## 2016-01-14 DIAGNOSIS — H02834 Dermatochalasis of left upper eyelid: Secondary | ICD-10-CM | POA: Diagnosis not present

## 2016-01-14 DIAGNOSIS — H04123 Dry eye syndrome of bilateral lacrimal glands: Secondary | ICD-10-CM | POA: Diagnosis not present

## 2016-01-14 DIAGNOSIS — H547 Unspecified visual loss: Secondary | ICD-10-CM | POA: Diagnosis not present

## 2016-01-14 DIAGNOSIS — H01024 Squamous blepharitis left upper eyelid: Secondary | ICD-10-CM | POA: Diagnosis not present

## 2016-01-14 DIAGNOSIS — H2513 Age-related nuclear cataract, bilateral: Secondary | ICD-10-CM | POA: Diagnosis not present

## 2016-01-14 DIAGNOSIS — H02831 Dermatochalasis of right upper eyelid: Secondary | ICD-10-CM | POA: Diagnosis not present

## 2016-01-28 DIAGNOSIS — I2782 Chronic pulmonary embolism: Secondary | ICD-10-CM | POA: Diagnosis not present

## 2016-01-28 DIAGNOSIS — I472 Ventricular tachycardia: Secondary | ICD-10-CM | POA: Diagnosis not present

## 2016-01-28 DIAGNOSIS — Z95818 Presence of other cardiac implants and grafts: Secondary | ICD-10-CM | POA: Diagnosis not present

## 2016-01-28 DIAGNOSIS — Z85038 Personal history of other malignant neoplasm of large intestine: Secondary | ICD-10-CM | POA: Diagnosis not present

## 2016-01-28 DIAGNOSIS — K219 Gastro-esophageal reflux disease without esophagitis: Secondary | ICD-10-CM | POA: Diagnosis not present

## 2016-01-28 DIAGNOSIS — Z955 Presence of coronary angioplasty implant and graft: Secondary | ICD-10-CM | POA: Diagnosis not present

## 2016-01-28 DIAGNOSIS — Z9221 Personal history of antineoplastic chemotherapy: Secondary | ICD-10-CM | POA: Diagnosis not present

## 2016-01-28 DIAGNOSIS — E785 Hyperlipidemia, unspecified: Secondary | ICD-10-CM | POA: Diagnosis not present

## 2016-01-28 DIAGNOSIS — Z8673 Personal history of transient ischemic attack (TIA), and cerebral infarction without residual deficits: Secondary | ICD-10-CM | POA: Diagnosis not present

## 2016-01-28 DIAGNOSIS — I2510 Atherosclerotic heart disease of native coronary artery without angina pectoris: Secondary | ICD-10-CM | POA: Diagnosis not present

## 2016-01-28 DIAGNOSIS — E119 Type 2 diabetes mellitus without complications: Secondary | ICD-10-CM | POA: Diagnosis not present

## 2016-01-28 DIAGNOSIS — I471 Supraventricular tachycardia: Secondary | ICD-10-CM | POA: Diagnosis not present

## 2016-01-28 DIAGNOSIS — I110 Hypertensive heart disease with heart failure: Secondary | ICD-10-CM | POA: Diagnosis not present

## 2016-01-28 DIAGNOSIS — I252 Old myocardial infarction: Secondary | ICD-10-CM | POA: Diagnosis not present

## 2016-01-28 DIAGNOSIS — I8291 Chronic embolism and thrombosis of unspecified vein: Secondary | ICD-10-CM | POA: Diagnosis not present

## 2016-01-28 DIAGNOSIS — I5032 Chronic diastolic (congestive) heart failure: Secondary | ICD-10-CM | POA: Diagnosis not present

## 2016-02-23 DIAGNOSIS — Z0000 Encounter for general adult medical examination without abnormal findings: Secondary | ICD-10-CM | POA: Diagnosis not present

## 2016-02-24 DIAGNOSIS — I2510 Atherosclerotic heart disease of native coronary artery without angina pectoris: Secondary | ICD-10-CM | POA: Diagnosis not present

## 2016-02-24 DIAGNOSIS — I5022 Chronic systolic (congestive) heart failure: Secondary | ICD-10-CM | POA: Diagnosis not present

## 2016-03-15 DIAGNOSIS — C20 Malignant neoplasm of rectum: Secondary | ICD-10-CM | POA: Diagnosis not present

## 2016-03-15 DIAGNOSIS — Z9221 Personal history of antineoplastic chemotherapy: Secondary | ICD-10-CM | POA: Diagnosis not present

## 2016-03-23 DIAGNOSIS — C20 Malignant neoplasm of rectum: Secondary | ICD-10-CM | POA: Diagnosis not present

## 2016-03-29 DIAGNOSIS — C20 Malignant neoplasm of rectum: Secondary | ICD-10-CM | POA: Diagnosis not present

## 2016-05-02 DIAGNOSIS — N1330 Unspecified hydronephrosis: Secondary | ICD-10-CM | POA: Diagnosis not present

## 2016-05-02 DIAGNOSIS — R399 Unspecified symptoms and signs involving the genitourinary system: Secondary | ICD-10-CM | POA: Diagnosis not present

## 2016-05-02 DIAGNOSIS — Z85048 Personal history of other malignant neoplasm of rectum, rectosigmoid junction, and anus: Secondary | ICD-10-CM | POA: Diagnosis not present

## 2016-05-31 DIAGNOSIS — J0191 Acute recurrent sinusitis, unspecified: Secondary | ICD-10-CM | POA: Diagnosis not present

## 2016-07-28 DIAGNOSIS — I472 Ventricular tachycardia: Secondary | ICD-10-CM | POA: Diagnosis not present

## 2016-07-28 DIAGNOSIS — I2510 Atherosclerotic heart disease of native coronary artery without angina pectoris: Secondary | ICD-10-CM | POA: Diagnosis not present

## 2016-07-28 DIAGNOSIS — E785 Hyperlipidemia, unspecified: Secondary | ICD-10-CM | POA: Diagnosis not present

## 2016-07-28 DIAGNOSIS — I110 Hypertensive heart disease with heart failure: Secondary | ICD-10-CM | POA: Diagnosis not present

## 2016-07-28 DIAGNOSIS — Z79899 Other long term (current) drug therapy: Secondary | ICD-10-CM | POA: Diagnosis not present

## 2016-07-28 DIAGNOSIS — I252 Old myocardial infarction: Secondary | ICD-10-CM | POA: Diagnosis not present

## 2016-07-28 DIAGNOSIS — Z8673 Personal history of transient ischemic attack (TIA), and cerebral infarction without residual deficits: Secondary | ICD-10-CM | POA: Diagnosis not present

## 2016-07-28 DIAGNOSIS — I214 Non-ST elevation (NSTEMI) myocardial infarction: Secondary | ICD-10-CM | POA: Diagnosis not present

## 2016-07-28 DIAGNOSIS — Z7902 Long term (current) use of antithrombotics/antiplatelets: Secondary | ICD-10-CM | POA: Diagnosis not present

## 2016-07-28 DIAGNOSIS — I5032 Chronic diastolic (congestive) heart failure: Secondary | ICD-10-CM | POA: Diagnosis not present

## 2016-07-28 DIAGNOSIS — Z7982 Long term (current) use of aspirin: Secondary | ICD-10-CM | POA: Diagnosis not present

## 2016-07-28 DIAGNOSIS — Z7901 Long term (current) use of anticoagulants: Secondary | ICD-10-CM | POA: Diagnosis not present

## 2016-10-04 DIAGNOSIS — Z85048 Personal history of other malignant neoplasm of rectum, rectosigmoid junction, and anus: Secondary | ICD-10-CM | POA: Diagnosis not present

## 2016-10-04 DIAGNOSIS — Z23 Encounter for immunization: Secondary | ICD-10-CM | POA: Diagnosis not present

## 2016-10-04 DIAGNOSIS — Z880 Allergy status to penicillin: Secondary | ICD-10-CM | POA: Diagnosis not present

## 2016-10-04 DIAGNOSIS — C20 Malignant neoplasm of rectum: Secondary | ICD-10-CM | POA: Diagnosis not present

## 2016-10-04 DIAGNOSIS — N1330 Unspecified hydronephrosis: Secondary | ICD-10-CM | POA: Diagnosis not present

## 2016-10-04 DIAGNOSIS — K439 Ventral hernia without obstruction or gangrene: Secondary | ICD-10-CM | POA: Diagnosis not present

## 2016-10-04 DIAGNOSIS — Z08 Encounter for follow-up examination after completed treatment for malignant neoplasm: Secondary | ICD-10-CM | POA: Diagnosis not present

## 2016-10-04 DIAGNOSIS — Z888 Allergy status to other drugs, medicaments and biological substances status: Secondary | ICD-10-CM | POA: Diagnosis not present

## 2017-01-16 DIAGNOSIS — J0120 Acute ethmoidal sinusitis, unspecified: Secondary | ICD-10-CM | POA: Diagnosis not present

## 2017-02-24 DIAGNOSIS — J111 Influenza due to unidentified influenza virus with other respiratory manifestations: Secondary | ICD-10-CM | POA: Diagnosis not present

## 2017-06-17 DIAGNOSIS — I110 Hypertensive heart disease with heart failure: Secondary | ICD-10-CM | POA: Diagnosis not present

## 2017-06-17 DIAGNOSIS — R918 Other nonspecific abnormal finding of lung field: Secondary | ICD-10-CM | POA: Diagnosis not present

## 2017-06-17 DIAGNOSIS — Z7982 Long term (current) use of aspirin: Secondary | ICD-10-CM | POA: Diagnosis not present

## 2017-06-17 DIAGNOSIS — R42 Dizziness and giddiness: Secondary | ICD-10-CM | POA: Diagnosis not present

## 2017-06-17 DIAGNOSIS — I5030 Unspecified diastolic (congestive) heart failure: Secondary | ICD-10-CM | POA: Diagnosis not present

## 2017-06-17 DIAGNOSIS — R0989 Other specified symptoms and signs involving the circulatory and respiratory systems: Secondary | ICD-10-CM | POA: Diagnosis not present

## 2017-06-17 DIAGNOSIS — R51 Headache: Secondary | ICD-10-CM | POA: Diagnosis not present

## 2017-06-17 DIAGNOSIS — N400 Enlarged prostate without lower urinary tract symptoms: Secondary | ICD-10-CM | POA: Diagnosis not present

## 2017-06-17 DIAGNOSIS — Z7902 Long term (current) use of antithrombotics/antiplatelets: Secondary | ICD-10-CM | POA: Diagnosis not present

## 2017-06-17 DIAGNOSIS — I252 Old myocardial infarction: Secondary | ICD-10-CM | POA: Diagnosis not present

## 2017-06-17 DIAGNOSIS — F329 Major depressive disorder, single episode, unspecified: Secondary | ICD-10-CM | POA: Diagnosis not present

## 2017-06-17 DIAGNOSIS — E785 Hyperlipidemia, unspecified: Secondary | ICD-10-CM | POA: Diagnosis not present

## 2017-06-17 DIAGNOSIS — Z955 Presence of coronary angioplasty implant and graft: Secondary | ICD-10-CM | POA: Diagnosis not present

## 2017-06-17 DIAGNOSIS — R072 Precordial pain: Secondary | ICD-10-CM | POA: Diagnosis not present

## 2017-06-17 DIAGNOSIS — I2510 Atherosclerotic heart disease of native coronary artery without angina pectoris: Secondary | ICD-10-CM | POA: Diagnosis not present

## 2017-06-17 DIAGNOSIS — R0789 Other chest pain: Secondary | ICD-10-CM | POA: Diagnosis not present

## 2017-06-17 DIAGNOSIS — R079 Chest pain, unspecified: Secondary | ICD-10-CM | POA: Diagnosis not present

## 2017-06-18 DIAGNOSIS — I110 Hypertensive heart disease with heart failure: Secondary | ICD-10-CM | POA: Diagnosis not present

## 2017-06-18 DIAGNOSIS — N400 Enlarged prostate without lower urinary tract symptoms: Secondary | ICD-10-CM | POA: Diagnosis not present

## 2017-06-18 DIAGNOSIS — E785 Hyperlipidemia, unspecified: Secondary | ICD-10-CM | POA: Diagnosis not present

## 2017-06-18 DIAGNOSIS — Z7902 Long term (current) use of antithrombotics/antiplatelets: Secondary | ICD-10-CM | POA: Diagnosis not present

## 2017-06-18 DIAGNOSIS — I252 Old myocardial infarction: Secondary | ICD-10-CM | POA: Diagnosis not present

## 2017-06-18 DIAGNOSIS — R079 Chest pain, unspecified: Secondary | ICD-10-CM | POA: Diagnosis not present

## 2017-06-18 DIAGNOSIS — Z7982 Long term (current) use of aspirin: Secondary | ICD-10-CM | POA: Diagnosis not present

## 2017-06-18 DIAGNOSIS — R072 Precordial pain: Secondary | ICD-10-CM | POA: Diagnosis not present

## 2017-06-18 DIAGNOSIS — I2510 Atherosclerotic heart disease of native coronary artery without angina pectoris: Secondary | ICD-10-CM | POA: Diagnosis not present

## 2017-06-18 DIAGNOSIS — I5030 Unspecified diastolic (congestive) heart failure: Secondary | ICD-10-CM | POA: Diagnosis not present

## 2017-06-19 DIAGNOSIS — F329 Major depressive disorder, single episode, unspecified: Secondary | ICD-10-CM | POA: Diagnosis not present

## 2017-06-19 DIAGNOSIS — Z7982 Long term (current) use of aspirin: Secondary | ICD-10-CM | POA: Diagnosis not present

## 2017-06-19 DIAGNOSIS — I252 Old myocardial infarction: Secondary | ICD-10-CM | POA: Diagnosis not present

## 2017-06-19 DIAGNOSIS — Z955 Presence of coronary angioplasty implant and graft: Secondary | ICD-10-CM | POA: Diagnosis not present

## 2017-06-19 DIAGNOSIS — R0789 Other chest pain: Secondary | ICD-10-CM | POA: Diagnosis not present

## 2017-06-19 DIAGNOSIS — E785 Hyperlipidemia, unspecified: Secondary | ICD-10-CM | POA: Diagnosis not present

## 2017-06-19 DIAGNOSIS — N401 Enlarged prostate with lower urinary tract symptoms: Secondary | ICD-10-CM | POA: Diagnosis not present

## 2017-06-19 DIAGNOSIS — R079 Chest pain, unspecified: Secondary | ICD-10-CM | POA: Diagnosis not present

## 2017-06-19 DIAGNOSIS — R9431 Abnormal electrocardiogram [ECG] [EKG]: Secondary | ICD-10-CM | POA: Diagnosis not present

## 2017-06-19 DIAGNOSIS — R072 Precordial pain: Secondary | ICD-10-CM | POA: Diagnosis not present

## 2017-06-19 DIAGNOSIS — R42 Dizziness and giddiness: Secondary | ICD-10-CM | POA: Diagnosis not present

## 2017-06-19 DIAGNOSIS — Z9861 Coronary angioplasty status: Secondary | ICD-10-CM | POA: Diagnosis not present

## 2017-07-27 DIAGNOSIS — I252 Old myocardial infarction: Secondary | ICD-10-CM | POA: Diagnosis not present

## 2017-07-27 DIAGNOSIS — Z9861 Coronary angioplasty status: Secondary | ICD-10-CM | POA: Diagnosis not present

## 2017-07-27 DIAGNOSIS — Z955 Presence of coronary angioplasty implant and graft: Secondary | ICD-10-CM | POA: Diagnosis not present

## 2017-07-27 DIAGNOSIS — I2510 Atherosclerotic heart disease of native coronary artery without angina pectoris: Secondary | ICD-10-CM | POA: Diagnosis not present

## 2017-07-27 DIAGNOSIS — I5040 Unspecified combined systolic (congestive) and diastolic (congestive) heart failure: Secondary | ICD-10-CM | POA: Diagnosis not present

## 2017-07-27 DIAGNOSIS — I110 Hypertensive heart disease with heart failure: Secondary | ICD-10-CM | POA: Diagnosis not present

## 2017-07-27 DIAGNOSIS — Z7982 Long term (current) use of aspirin: Secondary | ICD-10-CM | POA: Diagnosis not present

## 2017-07-27 DIAGNOSIS — Z7902 Long term (current) use of antithrombotics/antiplatelets: Secondary | ICD-10-CM | POA: Diagnosis not present

## 2017-07-27 DIAGNOSIS — Z8673 Personal history of transient ischemic attack (TIA), and cerebral infarction without residual deficits: Secondary | ICD-10-CM | POA: Diagnosis not present

## 2017-07-27 DIAGNOSIS — Z79899 Other long term (current) drug therapy: Secondary | ICD-10-CM | POA: Diagnosis not present

## 2017-08-07 DIAGNOSIS — I5042 Chronic combined systolic (congestive) and diastolic (congestive) heart failure: Secondary | ICD-10-CM | POA: Diagnosis not present

## 2017-08-07 DIAGNOSIS — Z87898 Personal history of other specified conditions: Secondary | ICD-10-CM | POA: Diagnosis not present

## 2017-08-29 DIAGNOSIS — F329 Major depressive disorder, single episode, unspecified: Secondary | ICD-10-CM | POA: Diagnosis not present

## 2017-08-29 DIAGNOSIS — F419 Anxiety disorder, unspecified: Secondary | ICD-10-CM | POA: Diagnosis not present

## 2017-11-24 DIAGNOSIS — Z89021 Acquired absence of right finger(s): Secondary | ICD-10-CM | POA: Diagnosis not present

## 2017-11-24 DIAGNOSIS — F329 Major depressive disorder, single episode, unspecified: Secondary | ICD-10-CM | POA: Diagnosis not present

## 2017-11-24 DIAGNOSIS — R5383 Other fatigue: Secondary | ICD-10-CM | POA: Diagnosis not present

## 2017-11-24 DIAGNOSIS — R7309 Other abnormal glucose: Secondary | ICD-10-CM | POA: Diagnosis not present

## 2017-11-24 DIAGNOSIS — R454 Irritability and anger: Secondary | ICD-10-CM | POA: Diagnosis not present

## 2017-12-01 DIAGNOSIS — J069 Acute upper respiratory infection, unspecified: Secondary | ICD-10-CM | POA: Diagnosis not present

## 2017-12-01 DIAGNOSIS — B9789 Other viral agents as the cause of diseases classified elsewhere: Secondary | ICD-10-CM | POA: Diagnosis not present

## 2017-12-01 DIAGNOSIS — Z23 Encounter for immunization: Secondary | ICD-10-CM | POA: Diagnosis not present

## 2018-01-05 DIAGNOSIS — J0110 Acute frontal sinusitis, unspecified: Secondary | ICD-10-CM | POA: Diagnosis not present

## 2018-01-05 DIAGNOSIS — F329 Major depressive disorder, single episode, unspecified: Secondary | ICD-10-CM | POA: Diagnosis not present

## 2018-01-05 DIAGNOSIS — Z89021 Acquired absence of right finger(s): Secondary | ICD-10-CM | POA: Diagnosis not present

## 2018-01-05 DIAGNOSIS — I5032 Chronic diastolic (congestive) heart failure: Secondary | ICD-10-CM | POA: Diagnosis not present

## 2018-01-05 DIAGNOSIS — F419 Anxiety disorder, unspecified: Secondary | ICD-10-CM | POA: Diagnosis not present

## 2018-01-12 DIAGNOSIS — R05 Cough: Secondary | ICD-10-CM | POA: Diagnosis not present

## 2018-01-16 DIAGNOSIS — R05 Cough: Secondary | ICD-10-CM | POA: Diagnosis not present

## 2018-02-01 DIAGNOSIS — I2510 Atherosclerotic heart disease of native coronary artery without angina pectoris: Secondary | ICD-10-CM | POA: Diagnosis not present

## 2018-02-01 DIAGNOSIS — E785 Hyperlipidemia, unspecified: Secondary | ICD-10-CM | POA: Diagnosis not present

## 2018-02-01 DIAGNOSIS — I10 Essential (primary) hypertension: Secondary | ICD-10-CM | POA: Diagnosis not present

## 2018-02-02 DIAGNOSIS — J929 Pleural plaque without asbestos: Secondary | ICD-10-CM | POA: Diagnosis not present

## 2018-02-05 DIAGNOSIS — E7849 Other hyperlipidemia: Secondary | ICD-10-CM | POA: Diagnosis not present

## 2018-02-05 DIAGNOSIS — I5042 Chronic combined systolic (congestive) and diastolic (congestive) heart failure: Secondary | ICD-10-CM | POA: Diagnosis not present

## 2018-02-05 DIAGNOSIS — Z0000 Encounter for general adult medical examination without abnormal findings: Secondary | ICD-10-CM | POA: Diagnosis not present

## 2018-02-05 DIAGNOSIS — R7303 Prediabetes: Secondary | ICD-10-CM | POA: Diagnosis not present

## 2018-02-05 DIAGNOSIS — I25118 Atherosclerotic heart disease of native coronary artery with other forms of angina pectoris: Secondary | ICD-10-CM | POA: Diagnosis not present

## 2018-02-05 DIAGNOSIS — R413 Other amnesia: Secondary | ICD-10-CM | POA: Diagnosis not present

## 2018-02-05 DIAGNOSIS — F329 Major depressive disorder, single episode, unspecified: Secondary | ICD-10-CM | POA: Diagnosis not present

## 2018-02-05 DIAGNOSIS — Z77090 Contact with and (suspected) exposure to asbestos: Secondary | ICD-10-CM | POA: Diagnosis not present

## 2018-02-05 DIAGNOSIS — J929 Pleural plaque without asbestos: Secondary | ICD-10-CM | POA: Diagnosis not present

## 2018-02-15 DIAGNOSIS — J929 Pleural plaque without asbestos: Secondary | ICD-10-CM | POA: Diagnosis not present

## 2018-03-02 DIAGNOSIS — Z89021 Acquired absence of right finger(s): Secondary | ICD-10-CM | POA: Diagnosis not present

## 2018-03-02 DIAGNOSIS — F329 Major depressive disorder, single episode, unspecified: Secondary | ICD-10-CM | POA: Diagnosis not present

## 2018-03-02 DIAGNOSIS — R413 Other amnesia: Secondary | ICD-10-CM | POA: Diagnosis not present

## 2019-02-28 ENCOUNTER — Ambulatory Visit: Payer: Self-pay | Attending: Internal Medicine

## 2019-02-28 DIAGNOSIS — Z23 Encounter for immunization: Secondary | ICD-10-CM

## 2019-02-28 NOTE — Progress Notes (Signed)
   Covid-19 Vaccination Clinic  Name:  Brandon Mora    MRN: [id number] DOB: June 11, 1945  02/28/2019  [name] was observed post Covid-19 immunization for 15 minutes without incidence. He was provided with Vaccine Information Sheet and instruction to access the V-Safe system.   [name] was instructed to call 911 with any severe reactions post vaccine: Marland Kitchen Difficulty breathing  . Swelling of your face and throat  . A fast heartbeat  . A bad rash all over your body  . Dizziness and weakness    Immunizations Administered    Name Date Dose VIS Date Route   Pfizer COVID-19 Vaccine 02/28/2019  2:03 PM 0.3 mL 12/14/2018 Intramuscular   Manufacturer: [place]   Lot: [id number]   [place]: [id number]

## 2019-03-26 ENCOUNTER — Ambulatory Visit: Payer: Medicare HMO | Attending: Internal Medicine

## 2019-03-26 DIAGNOSIS — Z23 Encounter for immunization: Secondary | ICD-10-CM

## 2019-03-26 NOTE — Progress Notes (Signed)
   Covid-19 Vaccination Clinic  Name:  Brandon Mora    MRN: [id number] DOB: 23-Jun-1945  03/26/2019  Mr. Bennetts was observed post Covid-19 immunization for 15 minutes without incident. He was provided with Vaccine Information Sheet and instruction to access the V-Safe system.   Mr. Handley was instructed to call 911 with any severe reactions post vaccine: Marland Kitchen Difficulty breathing  . Swelling of face and throat  . A fast heartbeat  . A bad rash all over body  . Dizziness and weakness   Immunizations Administered    Name Date Dose VIS Date Route   Pfizer COVID-19 Vaccine 03/26/2019 11:20 AM 0.3 mL 12/14/2018 Intramuscular   Manufacturer: [place]   Lot: [id number]   [place]: [id number]

## 2020-09-11 ENCOUNTER — Telehealth (HOSPITAL_COMMUNITY): Payer: Self-pay

## 2020-09-11 ENCOUNTER — Encounter (HOSPITAL_COMMUNITY): Payer: Self-pay

## 2020-09-11 NOTE — Telephone Encounter (Signed)
Outside/paper referral received by Dr. Steva Ready from [facility]. Will fax over Physician order and request further documents. Insurance benefits and eligibility to be determined.

## 2020-09-11 NOTE — Telephone Encounter (Signed)
Attempted to call patient in regards to Cardiac Rehab - LM on VM Mailed letter 

## 2020-10-02 ENCOUNTER — Telehealth (HOSPITAL_COMMUNITY): Payer: Self-pay

## 2020-10-02 NOTE — Telephone Encounter (Signed)
No repsonse from pt.  Closed referral
